# Patient Record
Sex: Female | Born: 1950 | Race: White | Hispanic: No | Smoking: Never smoker
Health system: Southern US, Community
[De-identification: ages and names within clinical notes are randomized; demographics above are authoritative.]

## PROBLEM LIST (undated history)

## (undated) DIAGNOSIS — J449 Chronic obstructive pulmonary disease, unspecified: Secondary | ICD-10-CM

## (undated) DIAGNOSIS — I8390 Asymptomatic varicose veins of unspecified lower extremity: Secondary | ICD-10-CM

## (undated) DIAGNOSIS — E785 Hyperlipidemia, unspecified: Secondary | ICD-10-CM

## (undated) DIAGNOSIS — K5790 Diverticulosis of intestine, part unspecified, without perforation or abscess without bleeding: Secondary | ICD-10-CM

## (undated) DIAGNOSIS — R519 Headache, unspecified: Secondary | ICD-10-CM

## (undated) DIAGNOSIS — Z889 Allergy status to unspecified drugs, medicaments and biological substances status: Secondary | ICD-10-CM

## (undated) DIAGNOSIS — K2270 Barrett's esophagus without dysplasia: Secondary | ICD-10-CM

## (undated) DIAGNOSIS — IMO0001 Reserved for inherently not codable concepts without codable children: Secondary | ICD-10-CM

## (undated) DIAGNOSIS — M810 Age-related osteoporosis without current pathological fracture: Secondary | ICD-10-CM

## (undated) DIAGNOSIS — F32A Depression, unspecified: Secondary | ICD-10-CM

## (undated) DIAGNOSIS — R51 Headache: Secondary | ICD-10-CM

## (undated) DIAGNOSIS — K219 Gastro-esophageal reflux disease without esophagitis: Secondary | ICD-10-CM

## (undated) DIAGNOSIS — A0472 Enterocolitis due to Clostridium difficile, not specified as recurrent: Secondary | ICD-10-CM

## (undated) DIAGNOSIS — F329 Major depressive disorder, single episode, unspecified: Secondary | ICD-10-CM

## (undated) DIAGNOSIS — J45909 Unspecified asthma, uncomplicated: Secondary | ICD-10-CM

## (undated) DIAGNOSIS — Z8719 Personal history of other diseases of the digestive system: Secondary | ICD-10-CM

## (undated) DIAGNOSIS — Z8709 Personal history of other diseases of the respiratory system: Secondary | ICD-10-CM

## (undated) DIAGNOSIS — R197 Diarrhea, unspecified: Secondary | ICD-10-CM

## (undated) HISTORY — PX: TONSILLECTOMY: SUR1361

## (undated) HISTORY — DX: Enterocolitis due to Clostridium difficile, not specified as recurrent: A04.72

## (undated) HISTORY — PX: DILATION AND CURETTAGE OF UTERUS: SHX78

## (undated) HISTORY — PX: APPENDECTOMY: SHX54

## (undated) HISTORY — PX: TUBAL LIGATION: SHX77

## (undated) HISTORY — PX: COLONOSCOPY WITH ESOPHAGOGASTRODUODENOSCOPY (EGD): SHX5779

---

## 2007-06-21 ENCOUNTER — Ambulatory Visit: Payer: Self-pay | Admitting: Family Medicine

## 2007-10-11 ENCOUNTER — Ambulatory Visit: Payer: Self-pay | Admitting: Family Medicine

## 2011-03-13 ENCOUNTER — Ambulatory Visit: Payer: Self-pay | Admitting: Gastroenterology

## 2011-04-16 ENCOUNTER — Ambulatory Visit: Payer: Self-pay | Admitting: Gastroenterology

## 2011-04-21 LAB — PATHOLOGY REPORT

## 2011-09-18 ENCOUNTER — Ambulatory Visit: Payer: Self-pay | Admitting: Gastroenterology

## 2011-09-29 ENCOUNTER — Ambulatory Visit: Payer: Self-pay | Admitting: Gastroenterology

## 2012-03-18 ENCOUNTER — Ambulatory Visit: Payer: Self-pay | Admitting: Gastroenterology

## 2013-11-11 DIAGNOSIS — R519 Headache, unspecified: Secondary | ICD-10-CM | POA: Insufficient documentation

## 2014-03-02 ENCOUNTER — Ambulatory Visit: Payer: Self-pay | Admitting: Gastroenterology

## 2014-03-06 LAB — PATHOLOGY REPORT

## 2014-04-26 DIAGNOSIS — G43909 Migraine, unspecified, not intractable, without status migrainosus: Secondary | ICD-10-CM | POA: Insufficient documentation

## 2014-04-26 DIAGNOSIS — K219 Gastro-esophageal reflux disease without esophagitis: Secondary | ICD-10-CM | POA: Insufficient documentation

## 2014-05-02 ENCOUNTER — Ambulatory Visit: Payer: Self-pay | Admitting: Gastroenterology

## 2015-04-29 DIAGNOSIS — F32A Depression, unspecified: Secondary | ICD-10-CM | POA: Insufficient documentation

## 2015-11-08 DIAGNOSIS — M810 Age-related osteoporosis without current pathological fracture: Secondary | ICD-10-CM | POA: Insufficient documentation

## 2015-11-08 DIAGNOSIS — T7840XA Allergy, unspecified, initial encounter: Secondary | ICD-10-CM | POA: Insufficient documentation

## 2015-11-08 DIAGNOSIS — E785 Hyperlipidemia, unspecified: Secondary | ICD-10-CM | POA: Insufficient documentation

## 2015-11-13 ENCOUNTER — Other Ambulatory Visit: Payer: Self-pay | Admitting: Family Medicine

## 2015-11-13 DIAGNOSIS — Z1231 Encounter for screening mammogram for malignant neoplasm of breast: Secondary | ICD-10-CM

## 2015-11-28 ENCOUNTER — Ambulatory Visit: Payer: Self-pay

## 2015-12-04 DIAGNOSIS — Z1231 Encounter for screening mammogram for malignant neoplasm of breast: Secondary | ICD-10-CM | POA: Diagnosis not present

## 2015-12-09 DIAGNOSIS — M810 Age-related osteoporosis without current pathological fracture: Secondary | ICD-10-CM | POA: Diagnosis not present

## 2015-12-29 DIAGNOSIS — R05 Cough: Secondary | ICD-10-CM | POA: Diagnosis not present

## 2015-12-29 DIAGNOSIS — J45901 Unspecified asthma with (acute) exacerbation: Secondary | ICD-10-CM | POA: Diagnosis not present

## 2016-01-09 DIAGNOSIS — K219 Gastro-esophageal reflux disease without esophagitis: Secondary | ICD-10-CM | POA: Diagnosis not present

## 2016-01-09 DIAGNOSIS — J4531 Mild persistent asthma with (acute) exacerbation: Secondary | ICD-10-CM | POA: Diagnosis not present

## 2016-01-09 DIAGNOSIS — J310 Chronic rhinitis: Secondary | ICD-10-CM | POA: Diagnosis not present

## 2016-02-12 DIAGNOSIS — K2270 Barrett's esophagus without dysplasia: Secondary | ICD-10-CM | POA: Diagnosis not present

## 2016-02-12 DIAGNOSIS — Z86010 Personal history of colonic polyps: Secondary | ICD-10-CM | POA: Diagnosis not present

## 2016-03-23 DIAGNOSIS — A0472 Enterocolitis due to Clostridium difficile, not specified as recurrent: Secondary | ICD-10-CM

## 2016-03-23 HISTORY — DX: Enterocolitis due to Clostridium difficile, not specified as recurrent: A04.72

## 2016-03-27 ENCOUNTER — Encounter: Payer: Self-pay | Admitting: *Deleted

## 2016-03-30 ENCOUNTER — Ambulatory Visit: Payer: PPO | Admitting: Anesthesiology

## 2016-03-30 ENCOUNTER — Encounter
Admission: RE | Disposition: A | Discharge status: Home / Self Care | Payer: Self-pay | Source: Ambulatory Visit | Attending: Gastroenterology

## 2016-03-30 ENCOUNTER — Ambulatory Visit
Admission: RE | Admit: 2016-03-30 | Discharge: 2016-03-30 | Disposition: A | Discharge status: Home / Self Care | Payer: PPO | Source: Ambulatory Visit | Attending: Gastroenterology | Admitting: Gastroenterology

## 2016-03-30 ENCOUNTER — Encounter: Payer: Self-pay | Admitting: *Deleted

## 2016-03-30 DIAGNOSIS — Z1211 Encounter for screening for malignant neoplasm of colon: Secondary | ICD-10-CM | POA: Diagnosis not present

## 2016-03-30 DIAGNOSIS — K2270 Barrett's esophagus without dysplasia: Secondary | ICD-10-CM | POA: Diagnosis not present

## 2016-03-30 DIAGNOSIS — D128 Benign neoplasm of rectum: Secondary | ICD-10-CM | POA: Diagnosis not present

## 2016-03-30 DIAGNOSIS — K5730 Diverticulosis of large intestine without perforation or abscess without bleeding: Secondary | ICD-10-CM | POA: Insufficient documentation

## 2016-03-30 DIAGNOSIS — E785 Hyperlipidemia, unspecified: Secondary | ICD-10-CM | POA: Diagnosis not present

## 2016-03-30 DIAGNOSIS — K621 Rectal polyp: Secondary | ICD-10-CM | POA: Insufficient documentation

## 2016-03-30 DIAGNOSIS — D125 Benign neoplasm of sigmoid colon: Secondary | ICD-10-CM | POA: Insufficient documentation

## 2016-03-30 DIAGNOSIS — K219 Gastro-esophageal reflux disease without esophagitis: Secondary | ICD-10-CM | POA: Diagnosis not present

## 2016-03-30 DIAGNOSIS — D123 Benign neoplasm of transverse colon: Secondary | ICD-10-CM | POA: Diagnosis not present

## 2016-03-30 DIAGNOSIS — Z86010 Personal history of colonic polyps: Secondary | ICD-10-CM | POA: Diagnosis not present

## 2016-03-30 DIAGNOSIS — K449 Diaphragmatic hernia without obstruction or gangrene: Secondary | ICD-10-CM | POA: Insufficient documentation

## 2016-03-30 DIAGNOSIS — Z7951 Long term (current) use of inhaled steroids: Secondary | ICD-10-CM | POA: Diagnosis not present

## 2016-03-30 DIAGNOSIS — J45909 Unspecified asthma, uncomplicated: Secondary | ICD-10-CM | POA: Insufficient documentation

## 2016-03-30 DIAGNOSIS — F329 Major depressive disorder, single episode, unspecified: Secondary | ICD-10-CM | POA: Insufficient documentation

## 2016-03-30 DIAGNOSIS — K2980 Duodenitis without bleeding: Secondary | ICD-10-CM | POA: Diagnosis not present

## 2016-03-30 DIAGNOSIS — K2970 Gastritis, unspecified, without bleeding: Secondary | ICD-10-CM | POA: Insufficient documentation

## 2016-03-30 DIAGNOSIS — J449 Chronic obstructive pulmonary disease, unspecified: Secondary | ICD-10-CM | POA: Insufficient documentation

## 2016-03-30 DIAGNOSIS — K635 Polyp of colon: Secondary | ICD-10-CM | POA: Diagnosis not present

## 2016-03-30 DIAGNOSIS — K2960 Other gastritis without bleeding: Secondary | ICD-10-CM | POA: Diagnosis not present

## 2016-03-30 DIAGNOSIS — Z8719 Personal history of other diseases of the digestive system: Secondary | ICD-10-CM | POA: Diagnosis not present

## 2016-03-30 HISTORY — DX: Age-related osteoporosis without current pathological fracture: M81.0

## 2016-03-30 HISTORY — DX: Headache, unspecified: R51.9

## 2016-03-30 HISTORY — PX: ESOPHAGOGASTRODUODENOSCOPY (EGD) WITH PROPOFOL: SHX5813

## 2016-03-30 HISTORY — DX: Personal history of other diseases of the digestive system: Z87.19

## 2016-03-30 HISTORY — DX: Reserved for inherently not codable concepts without codable children: IMO0001

## 2016-03-30 HISTORY — DX: Unspecified asthma, uncomplicated: J45.909

## 2016-03-30 HISTORY — DX: Major depressive disorder, single episode, unspecified: F32.9

## 2016-03-30 HISTORY — DX: Hyperlipidemia, unspecified: E78.5

## 2016-03-30 HISTORY — DX: Chronic obstructive pulmonary disease, unspecified: J44.9

## 2016-03-30 HISTORY — DX: Gastro-esophageal reflux disease without esophagitis: K21.9

## 2016-03-30 HISTORY — PX: COLONOSCOPY WITH PROPOFOL: SHX5780

## 2016-03-30 HISTORY — DX: Headache: R51

## 2016-03-30 HISTORY — DX: Asymptomatic varicose veins of unspecified lower extremity: I83.90

## 2016-03-30 HISTORY — DX: Depression, unspecified: F32.A

## 2016-03-30 HISTORY — DX: Barrett's esophagus without dysplasia: K22.70

## 2016-03-30 SURGERY — COLONOSCOPY WITH PROPOFOL
Anesthesia: General

## 2016-03-30 MED ORDER — SODIUM CHLORIDE 0.9 % IV SOLN: INTRAVENOUS | Status: DC

## 2016-03-30 MED ORDER — PROPOFOL 10 MG/ML IV BOLUS
INTRAVENOUS | Status: DC | PRN
  Administered 2016-03-30: 20 mg via INTRAVENOUS
  Administered 2016-03-30 (×2): 30 mg via INTRAVENOUS

## 2016-03-30 MED ORDER — IPRATROPIUM-ALBUTEROL 0.5-2.5 (3) MG/3ML IN SOLN
3.0000 mL | Freq: Once | RESPIRATORY_TRACT | Status: AC
Start: 2016-03-30 — End: 2016-03-30
  Administered 2016-03-30: 3 mL via RESPIRATORY_TRACT

## 2016-03-30 MED ORDER — MIDAZOLAM HCL 5 MG/5ML IJ SOLN
INTRAMUSCULAR | Status: DC | PRN
  Administered 2016-03-30: 2 mg via INTRAVENOUS

## 2016-03-30 MED ORDER — LIDOCAINE HCL (CARDIAC) 20 MG/ML IV SOLN
INTRAVENOUS | Status: DC | PRN
  Administered 2016-03-30: 100 mg via INTRAVENOUS

## 2016-03-30 MED ORDER — IPRATROPIUM-ALBUTEROL 0.5-2.5 (3) MG/3ML IN SOLN
RESPIRATORY_TRACT | Status: AC
  Filled 2016-03-30: qty 3

## 2016-03-30 MED ORDER — SODIUM CHLORIDE 0.9 % IV SOLN
INTRAVENOUS | Status: DC
  Administered 2016-03-30: 09:00:00 via INTRAVENOUS
  Administered 2016-03-30: 1000 mL via INTRAVENOUS

## 2016-03-30 MED ORDER — PROPOFOL 500 MG/50ML IV EMUL
INTRAVENOUS | Status: DC | PRN
  Administered 2016-03-30: 140 ug/kg/min via INTRAVENOUS

## 2016-03-30 MED ORDER — EPHEDRINE SULFATE 50 MG/ML IJ SOLN
INTRAMUSCULAR | Status: DC | PRN
  Administered 2016-03-30 (×2): 10 mg via INTRAVENOUS

## 2016-03-30 NOTE — H&P (Signed)
Outpatient short stay form Pre-procedure 03/30/2016 9:24 AM Carly Sails MD  Primary Physician: Dr. Juluis Pitch  Reason for visit:  EGD and colonoscopy  History of present illness:  Patient is a 65 year old female presenting today for EGD and colonoscopy. She has personal history of Barrett's esophagus as well as adenomatous colon polyps. She tolerated her prep well. She takes no blood thinning agents or aspirin products. She does take omeprazole 20 mg daily. She has no dysphagia or heartburn symptoms.    Current facility-administered medications:  .  0.9 %  sodium chloride infusion, , Intravenous, Continuous, Carly Sails, MD, Last Rate: 20 mL/hr at 03/30/16 0857, 1,000 mL at 03/30/16 0857 .  0.9 %  sodium chloride infusion, , Intravenous, Continuous, Carly Sails, MD .  ipratropium-albuterol (DUONEB) 0.5-2.5 (3) MG/3ML nebulizer solution, , , ,   Prescriptions prior to admission  Medication Sig Dispense Refill Last Dose  . ALBUTEROL IN Inhale into the lungs.   03/30/2016 at 0600  . ALPRAZolam (XANAX) 0.5 MG tablet Take 0.5 mg by mouth at bedtime as needed for anxiety.     . Cholecalciferol 1000 units capsule Take 1,000 Units by mouth daily.     . cyclobenzaprine (FLEXERIL) 5 MG tablet Take 5 mg by mouth 3 (three) times daily as needed for muscle spasms.     . fexofenadine (ALLEGRA) 180 MG tablet Take 180 mg by mouth daily.     Marland Kitchen FLUoxetine (PROZAC) 20 MG capsule Take 20 mg by mouth daily.     . Multiple Vitamin (MULTIVITAMIN) tablet Take 1 tablet by mouth daily.     Marland Kitchen omeprazole (PRILOSEC) 20 MG capsule Take 20 mg by mouth daily.     . rizatriptan (MAXALT) 10 MG tablet Take 10 mg by mouth as needed for migraine. May repeat in 2 hours if needed     . simvastatin (ZOCOR) 20 MG tablet Take 20 mg by mouth daily.     . sucralfate (CARAFATE) 1 g tablet Take 1 g by mouth 4 (four) times daily -  with meals and at bedtime.     . traZODone (DESYREL) 100 MG tablet Take 100 mg by  mouth at bedtime.     . [DISCONTINUED] azelastine (ASTELIN) 0.1 % nasal spray Place into both nostrils 2 (two) times daily. Use in each nostril as directed     . [DISCONTINUED] budesonide-formoterol (SYMBICORT) 80-4.5 MCG/ACT inhaler Inhale 2 puffs into the lungs 2 (two) times daily.        Allergies  Allergen Reactions  . Penicillins      Past Medical History  Diagnosis Date  . Barrett's esophagus   . COPD (chronic obstructive pulmonary disease) ([place])   . Depression   . GERD (gastroesophageal reflux disease)   . History of hiatal hernia   . Varicose vein   . Hyperlipidemia   . Headache     migranes  . Osteoporosis, postmenopausal   . Asthma   . Shortness of breath dyspnea     Review of systems:      Physical Exam    Heart and lungs: Regular rate and rhythm without rub or gallop, lungs are bilaterally clear.    HEENT: Normocephalic atraumatic eyes are anicteric    Other:     Pertinant exam for procedure: Soft nontender nondistended bowel sounds positive normoactive.    Planned proceedures: EGD, colonoscopy and indicated procedures. I have discussed the risks benefits and complications of procedures to include not limited to bleeding, infection,  perforation and the risk of sedation and the patient wishes to proceed.    Carly Sails, MD Gastroenterology 03/30/2016  9:24 AM

## 2016-03-30 NOTE — Op Note (Signed)
[facility] Gastroenterology Patient Name: Carly Hoffman Procedure Date: 03/30/2016 9:27 AM MRN: [id number] Account #: [id number] Date of Birth: Jan 30, 1951 Admit Type: Outpatient Age: 65 Room: [facility] ENDO ROOM 3 Gender: Female Note Status: Finalized Procedure:            Colonoscopy Indications:          Personal history of colonic polyps Providers:            Lollie Sails, MD Referring MD:         Youlanda Roys. Lovie Macadamia, MD (Referring MD) Medicines:            Monitored Anesthesia Care Complications:        No immediate complications. Procedure:            Pre-Anesthesia Assessment:                       - ASA Grade Assessment: III - A patient with severe                        systemic disease.                       After obtaining informed consent, the colonoscope was                        passed under direct vision. Throughout the procedure,                        the patient's blood pressure, pulse, and oxygen                        saturations were monitored continuously. The                        Colonoscope was introduced through the anus and                        advanced to the the cecum, identified by appendiceal                        orifice and ileocecal valve. The colonoscopy was                        performed with moderate difficulty. Findings:      Multiple small-mouthed diverticula were found in the sigmoid colon and       descending colon.      A 16 mm polyp was found in the hepatic flexure. The polyp was sessile.       The polyp was removed with a cold biopsy forceps. The polyp was removed       with a cold snare. Resection and retrieval were complete.      A 4 mm polyp was found in the hepatic flexure. The polyp was sessile.       The polyp was removed with a cold biopsy forceps. Resection and       retrieval were complete.      A 3 mm polyp was found in the transverse colon. The polyp was sessile.       The polyp was removed with a  cold biopsy forceps. Resection and       retrieval were complete.  Two sessile polyps were found in the sigmoid colon. The polyps were 1 to       2 mm in size. These polyps were removed with a cold biopsy forceps.       Resection and retrieval were complete.      A 2 mm polyp was found in the rectum. The polyp was sessile. The polyp       was removed with a cold biopsy forceps. Resection and retrieval were       complete.      The digital rectal exam was normal. Impression:           - Diverticulosis in the sigmoid colon and in the                        descending colon.                       - One 16 mm polyp at the hepatic flexure, removed with                        a cold snare and removed with a cold biopsy forceps.                        Resected and retrieved.                       - One 4 mm polyp at the hepatic flexure, removed with a                        cold biopsy forceps. Resected and retrieved.                       - One 3 mm polyp in the transverse colon, removed with                        a cold biopsy forceps. Resected and retrieved.                       - Two 1 to 2 mm polyps in the sigmoid colon, removed                        with a cold biopsy forceps. Resected and retrieved.                       - One 2 mm polyp in the rectum, removed with a cold                        biopsy forceps. Resected and retrieved. Recommendation:       - Discharge patient to home.                       - Telephone GI clinic for pathology results in 1 week. Procedure Code(s):    --- Professional ---                       [phone], Colonoscopy, flexible; with removal of tumor(s),                        polyp(s), or other lesion(s) by snare technique  [id number], 74, Colonoscopy, flexible; with biopsy, single                        or multiple Diagnosis Code(s):    --- Professional ---                       D12.3, Benign neoplasm of transverse colon (hepatic                         flexure or splenic flexure)                       K62.1, Rectal polyp                       D12.5, Benign neoplasm of sigmoid colon                       Z86.010, Personal history of colonic polyps                       K57.30, Diverticulosis of large intestine without                        perforation or abscess without bleeding CPT copyright 2016 American Medical Association. All rights reserved. The codes documented in this report are preliminary and upon coder review may  be revised to meet current compliance requirements. Lollie Sails, MD 03/30/2016 10:39:03 AM This report has been signed electronically. Number of Addenda: 0 Note Initiated On: 03/30/2016 9:27 AM Scope Withdrawal Time: 0 hours 10 minutes 22 seconds  Total Procedure Duration: 0 hours 34 minutes 12 seconds       [facility]

## 2016-03-30 NOTE — Anesthesia Postprocedure Evaluation (Signed)
Anesthesia Post Note  Patient: ZAMAYAH ENSOR  Procedure(s) Performed: Procedure(s) (LRB): COLONOSCOPY WITH PROPOFOL (N/A) ESOPHAGOGASTRODUODENOSCOPY (EGD) WITH PROPOFOL (N/A)  Patient location during evaluation: Endoscopy Anesthesia Type: General Level of consciousness: awake and alert Pain management: pain level controlled Vital Signs Assessment: post-procedure vital signs reviewed and stable Respiratory status: spontaneous breathing, nonlabored ventilation, respiratory function stable and patient connected to nasal cannula oxygen Cardiovascular status: blood pressure returned to baseline and stable Postop Assessment: no signs of nausea or vomiting Anesthetic complications: no    Last Vitals:  Filed Vitals:   03/30/16 1100 03/30/16 1110  BP: 120/58 128/52  Pulse: 81 77  Temp:    Resp: 16 10    Last Pain: There were no vitals filed for this visit.               Precious Haws Piscitello

## 2016-03-30 NOTE — Op Note (Signed)
[facility] Gastroenterology Patient Name: Natily Hull Procedure Date: 03/30/2016 9:28 AM MRN: [id number] Account #: [id number] Date of Birth: July 30, 1951 Admit Type: Outpatient Age: 65 Room: [facility] ENDO ROOM 3 Gender: Female Note Status: Finalized Procedure:            Upper GI endoscopy Indications:          Follow-up of Barrett's esophagus Providers:            Lollie Sails, MD Referring MD:         Youlanda Roys. Lovie Macadamia, MD (Referring MD) Medicines:            Monitored Anesthesia Care Complications:        No immediate complications. Procedure:            Pre-Anesthesia Assessment:                       - ASA Grade Assessment: III - A patient with severe                        systemic disease.                       After obtaining informed consent, the endoscope was                        passed under direct vision. Throughout the procedure,                        the patient's blood pressure, pulse, and oxygen                        saturations were monitored continuously. The Endoscope                        was introduced through the mouth, and advanced to the                        third part of duodenum. The upper GI endoscopy was                        accomplished without difficulty. The patient tolerated                        the procedure. Findings:      A small sliding hiatal hernia was present.      There were esophageal mucosal changes secondary to established       short-segment Barrett's disease present at the gastroesophageal       junction. The maximum longitudinal extent of these mucosal changes was 1       cm in length. Mucosa was biopsied with a cold forceps for histology.       Mucosa was biopsied with a cold forceps for histology in 4 quadrants.      The exam of the esophagus was otherwise normal.      Patchy minimal inflammation characterized by erosions was found in the       gastric antrum. Biopsies were taken with a cold  forceps for histology.       Biopsies were taken with a cold forceps for histology and Helicobacter       pylori testing.  A small hiatal hernia was found. The Z-line was a variable distance from       incisors; the hiatal hernia was sliding.      The exam of the stomach was otherwise normal.      Patchy mild inflammation characterized by erythema and granularity was       found in the duodenal bulb. Impression:           - Small hiatal hernia.                       - Esophageal mucosal changes secondary to established                        short-segment Barrett's disease. Biopsied.                       - Gastritis. Biopsied.                       - Small hiatal hernia.                       - Duodenitis. Recommendation:       - Perform a colonoscopy today. Procedure Code(s):    --- Professional ---                       [phone], Esophagogastroduodenoscopy, flexible, transoral;                        with biopsy, single or multiple Diagnosis Code(s):    --- Professional ---                       K44.9, Diaphragmatic hernia without obstruction or                        gangrene                       K22.70, Barrett's esophagus without dysplasia                       K29.70, Gastritis, unspecified, without bleeding                       K29.80, Duodenitis without bleeding CPT copyright 2016 American Medical Association. All rights reserved. The codes documented in this report are preliminary and upon coder review may  be revised to meet current compliance requirements. Lollie Sails, MD 03/30/2016 9:56:00 AM This report has been signed electronically. Number of Addenda: 0 Note Initiated On: 03/30/2016 9:28 AM      [facility]

## 2016-03-30 NOTE — Anesthesia Preprocedure Evaluation (Signed)
Anesthesia Evaluation  Patient identified by MRN, date of birth, ID band Patient awake    Reviewed: Allergy & Precautions, H&P , NPO status , Patient's Chart, lab work & pertinent test results  History of Anesthesia Complications Negative for: history of anesthetic complications  Airway Mallampati: III  TM Distance: >3 FB Neck ROM: limited    Dental  (+) Poor Dentition, Chipped, Missing, Partial Lower, Partial Upper   Pulmonary shortness of breath and with exertion, asthma , COPD,    Pulmonary exam normal breath sounds clear to auscultation       Cardiovascular Exercise Tolerance: Good (-) angina(-) Past MI and (-) DOE negative cardio ROS Normal cardiovascular exam Rhythm:regular Rate:Normal     Neuro/Psych  Headaches, PSYCHIATRIC DISORDERS Depression    GI/Hepatic Neg liver ROS, hiatal hernia, GERD  ,  Endo/Other  negative endocrine ROS  Renal/GU negative Renal ROS  negative genitourinary   Musculoskeletal   Abdominal   Peds  Hematology negative hematology ROS (+)   Anesthesia Other Findings Past Medical History:   Barrett's esophagus                                          COPD (chronic obstructive pulmonary disease) (*              Depression                                                   GERD (gastroesophageal reflux disease)                       History of hiatal hernia                                     Varicose vein                                                Hyperlipidemia                                               Headache                                                       Comment:migranes   Osteoporosis, postmenopausal                                 Asthma  Shortness of breath dyspnea                                 Past Surgical History:   COLONOSCOPY WITH ESOPHAGOGASTRODUODENOSCOPY (E*               TUBAL LIGATION                                                 APPENDECTOMY                                                  TONSILLECTOMY                                                 DILATION AND CURETTAGE OF UTERUS                                Reproductive/Obstetrics negative OB ROS                             Anesthesia Physical Anesthesia Plan  ASA: III  Anesthesia Plan: General   Post-op Pain Management:    Induction:   Airway Management Planned:   Additional Equipment:   Intra-op Plan:   Post-operative Plan:   Informed Consent: I have reviewed the patients History and Physical, chart, labs and discussed the procedure including the risks, benefits and alternatives for the proposed anesthesia with the patient or authorized representative who has indicated his/her understanding and acceptance.   Dental Advisory Given  Plan Discussed with: Anesthesiologist, CRNA and Surgeon  Anesthesia Plan Comments:         Anesthesia Quick Evaluation

## 2016-03-30 NOTE — Transfer of Care (Signed)
Immediate Anesthesia Transfer of Care Note  Patient: Carly Hoffman  Procedure(s) Performed: Procedure(s): COLONOSCOPY WITH PROPOFOL (N/A) ESOPHAGOGASTRODUODENOSCOPY (EGD) WITH PROPOFOL (N/A)  Patient Location: PACU  Anesthesia Type:General  Level of Consciousness: awake  Airway & Oxygen Therapy: Patient Spontanous Breathing and Patient connected to nasal cannula oxygen  Post-op Assessment: Report given to RN and Post -op Vital signs reviewed and stable  Post vital signs: Reviewed and stable  Last Vitals:  Filed Vitals:   03/30/16 0842  BP: 123/84  Pulse: 80  Temp: 36.3 C  Resp: 18    Last Pain: There were no vitals filed for this visit.       Complications: No apparent anesthesia complications

## 2016-03-31 ENCOUNTER — Encounter: Payer: Self-pay | Admitting: Gastroenterology

## 2016-03-31 LAB — SURGICAL PATHOLOGY

## 2016-04-08 DIAGNOSIS — R1084 Generalized abdominal pain: Secondary | ICD-10-CM | POA: Diagnosis not present

## 2016-04-08 DIAGNOSIS — R109 Unspecified abdominal pain: Secondary | ICD-10-CM | POA: Diagnosis not present

## 2016-04-08 DIAGNOSIS — R197 Diarrhea, unspecified: Secondary | ICD-10-CM | POA: Diagnosis not present

## 2016-04-13 ENCOUNTER — Other Ambulatory Visit
Admission: RE | Admit: 2016-04-13 | Discharge: 2016-04-13 | Disposition: A | Discharge status: Home / Self Care | Payer: PPO | Source: Ambulatory Visit | Attending: Gastroenterology | Admitting: Gastroenterology

## 2016-04-13 DIAGNOSIS — R1084 Generalized abdominal pain: Secondary | ICD-10-CM | POA: Insufficient documentation

## 2016-04-13 DIAGNOSIS — R197 Diarrhea, unspecified: Secondary | ICD-10-CM | POA: Insufficient documentation

## 2016-04-13 LAB — GASTROINTESTINAL PANEL BY PCR, STOOL (REPLACES STOOL CULTURE)
Adenovirus F40/41: NOT DETECTED
Astrovirus: NOT DETECTED
CRYPTOSPORIDIUM: NOT DETECTED
Campylobacter species: NOT DETECTED
Cyclospora cayetanensis: NOT DETECTED
E. COLI O157: NOT DETECTED
ENTAMOEBA HISTOLYTICA: NOT DETECTED
Enteroaggregative E coli (EAEC): NOT DETECTED
Enteropathogenic E coli (EPEC): NOT DETECTED
Enterotoxigenic E coli (ETEC): NOT DETECTED
Giardia lamblia: NOT DETECTED
NOROVIRUS GI/GII: NOT DETECTED
PLESIMONAS SHIGELLOIDES: NOT DETECTED
Rotavirus A: NOT DETECTED
SALMONELLA SPECIES: NOT DETECTED
SAPOVIRUS (I, II, IV, AND V): NOT DETECTED
SHIGELLA/ENTEROINVASIVE E COLI (EIEC): NOT DETECTED
Shiga like toxin producing E coli (STEC): NOT DETECTED
VIBRIO CHOLERAE: NOT DETECTED
Vibrio species: NOT DETECTED
YERSINIA ENTEROCOLITICA: NOT DETECTED

## 2016-04-13 LAB — C DIFFICILE QUICK SCREEN W PCR REFLEX
C DIFFICILE (CDIFF) INTERP: POSITIVE
C Diff antigen: POSITIVE — AB
C Diff toxin: POSITIVE — AB

## 2016-04-29 DIAGNOSIS — F329 Major depressive disorder, single episode, unspecified: Secondary | ICD-10-CM | POA: Diagnosis not present

## 2016-04-29 DIAGNOSIS — R197 Diarrhea, unspecified: Secondary | ICD-10-CM | POA: Diagnosis not present

## 2016-04-29 DIAGNOSIS — E785 Hyperlipidemia, unspecified: Secondary | ICD-10-CM | POA: Diagnosis not present

## 2016-04-29 DIAGNOSIS — G43009 Migraine without aura, not intractable, without status migrainosus: Secondary | ICD-10-CM | POA: Diagnosis not present

## 2016-04-29 DIAGNOSIS — K22719 Barrett's esophagus with dysplasia, unspecified: Secondary | ICD-10-CM | POA: Diagnosis not present

## 2016-05-08 DIAGNOSIS — Z9851 Tubal ligation status: Secondary | ICD-10-CM | POA: Diagnosis not present

## 2016-05-08 DIAGNOSIS — Z880 Allergy status to penicillin: Secondary | ICD-10-CM | POA: Diagnosis not present

## 2016-05-08 DIAGNOSIS — Z8711 Personal history of peptic ulcer disease: Secondary | ICD-10-CM | POA: Diagnosis not present

## 2016-05-08 DIAGNOSIS — Z86010 Personal history of colonic polyps: Secondary | ICD-10-CM | POA: Diagnosis not present

## 2016-05-08 DIAGNOSIS — G4700 Insomnia, unspecified: Secondary | ICD-10-CM | POA: Diagnosis not present

## 2016-05-08 DIAGNOSIS — E869 Volume depletion, unspecified: Secondary | ICD-10-CM | POA: Diagnosis not present

## 2016-05-08 DIAGNOSIS — R509 Fever, unspecified: Secondary | ICD-10-CM | POA: Diagnosis not present

## 2016-05-08 DIAGNOSIS — R7303 Prediabetes: Secondary | ICD-10-CM | POA: Diagnosis not present

## 2016-05-08 DIAGNOSIS — Z801 Family history of malignant neoplasm of trachea, bronchus and lung: Secondary | ICD-10-CM | POA: Diagnosis not present

## 2016-05-08 DIAGNOSIS — F419 Anxiety disorder, unspecified: Secondary | ICD-10-CM | POA: Diagnosis not present

## 2016-05-08 DIAGNOSIS — F329 Major depressive disorder, single episode, unspecified: Secondary | ICD-10-CM | POA: Diagnosis not present

## 2016-05-08 DIAGNOSIS — E785 Hyperlipidemia, unspecified: Secondary | ICD-10-CM | POA: Diagnosis not present

## 2016-05-08 DIAGNOSIS — R739 Hyperglycemia, unspecified: Secondary | ICD-10-CM | POA: Diagnosis not present

## 2016-05-08 DIAGNOSIS — A047 Enterocolitis due to Clostridium difficile: Secondary | ICD-10-CM | POA: Diagnosis not present

## 2016-05-08 DIAGNOSIS — R109 Unspecified abdominal pain: Secondary | ICD-10-CM | POA: Diagnosis not present

## 2016-05-08 DIAGNOSIS — D72829 Elevated white blood cell count, unspecified: Secondary | ICD-10-CM | POA: Diagnosis not present

## 2016-05-08 DIAGNOSIS — R1084 Generalized abdominal pain: Secondary | ICD-10-CM | POA: Diagnosis not present

## 2016-05-08 DIAGNOSIS — K2270 Barrett's esophagus without dysplasia: Secondary | ICD-10-CM | POA: Diagnosis not present

## 2016-05-08 DIAGNOSIS — K649 Unspecified hemorrhoids: Secondary | ICD-10-CM | POA: Diagnosis not present

## 2016-05-08 DIAGNOSIS — J45909 Unspecified asthma, uncomplicated: Secondary | ICD-10-CM | POA: Diagnosis not present

## 2016-05-08 DIAGNOSIS — G43909 Migraine, unspecified, not intractable, without status migrainosus: Secondary | ICD-10-CM | POA: Diagnosis not present

## 2016-05-09 DIAGNOSIS — G43909 Migraine, unspecified, not intractable, without status migrainosus: Secondary | ICD-10-CM | POA: Diagnosis not present

## 2016-05-09 DIAGNOSIS — E785 Hyperlipidemia, unspecified: Secondary | ICD-10-CM | POA: Diagnosis not present

## 2016-05-09 DIAGNOSIS — A047 Enterocolitis due to Clostridium difficile: Secondary | ICD-10-CM | POA: Diagnosis not present

## 2016-05-09 DIAGNOSIS — J45909 Unspecified asthma, uncomplicated: Secondary | ICD-10-CM | POA: Diagnosis not present

## 2016-05-10 DIAGNOSIS — A047 Enterocolitis due to Clostridium difficile: Secondary | ICD-10-CM | POA: Diagnosis not present

## 2016-05-10 DIAGNOSIS — J45909 Unspecified asthma, uncomplicated: Secondary | ICD-10-CM | POA: Diagnosis not present

## 2016-05-10 DIAGNOSIS — G43909 Migraine, unspecified, not intractable, without status migrainosus: Secondary | ICD-10-CM | POA: Diagnosis not present

## 2016-05-10 DIAGNOSIS — E785 Hyperlipidemia, unspecified: Secondary | ICD-10-CM | POA: Diagnosis not present

## 2016-05-11 DIAGNOSIS — E785 Hyperlipidemia, unspecified: Secondary | ICD-10-CM | POA: Diagnosis not present

## 2016-05-11 DIAGNOSIS — A047 Enterocolitis due to Clostridium difficile: Secondary | ICD-10-CM | POA: Diagnosis not present

## 2016-05-11 DIAGNOSIS — J45909 Unspecified asthma, uncomplicated: Secondary | ICD-10-CM | POA: Diagnosis not present

## 2016-05-11 DIAGNOSIS — G43909 Migraine, unspecified, not intractable, without status migrainosus: Secondary | ICD-10-CM | POA: Diagnosis not present

## 2016-05-20 DIAGNOSIS — J310 Chronic rhinitis: Secondary | ICD-10-CM | POA: Diagnosis not present

## 2016-05-20 DIAGNOSIS — J4520 Mild intermittent asthma, uncomplicated: Secondary | ICD-10-CM | POA: Diagnosis not present

## 2016-05-25 DIAGNOSIS — R110 Nausea: Secondary | ICD-10-CM | POA: Diagnosis not present

## 2016-05-25 DIAGNOSIS — A047 Enterocolitis due to Clostridium difficile: Secondary | ICD-10-CM | POA: Diagnosis not present

## 2016-06-09 DIAGNOSIS — R197 Diarrhea, unspecified: Secondary | ICD-10-CM | POA: Diagnosis not present

## 2016-06-10 ENCOUNTER — Other Ambulatory Visit
Admission: RE | Admit: 2016-06-10 | Discharge: 2016-06-10 | Disposition: A | Discharge status: Home / Self Care | Payer: PPO | Source: Ambulatory Visit | Attending: Gastroenterology | Admitting: Gastroenterology

## 2016-06-10 DIAGNOSIS — R197 Diarrhea, unspecified: Secondary | ICD-10-CM | POA: Insufficient documentation

## 2016-06-10 LAB — GASTROINTESTINAL PANEL BY PCR, STOOL (REPLACES STOOL CULTURE)
ASTROVIRUS: NOT DETECTED
Adenovirus F40/41: NOT DETECTED
CAMPYLOBACTER SPECIES: NOT DETECTED
CRYPTOSPORIDIUM: NOT DETECTED
Cyclospora cayetanensis: NOT DETECTED
E. COLI O157: NOT DETECTED
ENTAMOEBA HISTOLYTICA: NOT DETECTED
ENTEROAGGREGATIVE E COLI (EAEC): NOT DETECTED
ENTEROTOXIGENIC E COLI (ETEC): NOT DETECTED
Enteropathogenic E coli (EPEC): NOT DETECTED
GIARDIA LAMBLIA: NOT DETECTED
NOROVIRUS GI/GII: NOT DETECTED
PLESIMONAS SHIGELLOIDES: NOT DETECTED
Rotavirus A: NOT DETECTED
SALMONELLA SPECIES: NOT DETECTED
SHIGELLA/ENTEROINVASIVE E COLI (EIEC): NOT DETECTED
Sapovirus (I, II, IV, and V): NOT DETECTED
Shiga like toxin producing E coli (STEC): NOT DETECTED
Vibrio cholerae: NOT DETECTED
Vibrio species: NOT DETECTED
Yersinia enterocolitica: NOT DETECTED

## 2016-06-10 LAB — C DIFFICILE QUICK SCREEN W PCR REFLEX
C DIFFICILE (CDIFF) TOXIN: POSITIVE — AB
C DIFFICLE (CDIFF) ANTIGEN: POSITIVE — AB
C Diff interpretation: DETECTED

## 2016-09-04 ENCOUNTER — Other Ambulatory Visit
Admission: RE | Admit: 2016-09-04 | Discharge: 2016-09-04 | Disposition: A | Discharge status: Home / Self Care | Payer: PPO | Source: Other Acute Inpatient Hospital | Attending: Gastroenterology | Admitting: Gastroenterology

## 2016-09-04 DIAGNOSIS — K529 Noninfective gastroenteritis and colitis, unspecified: Secondary | ICD-10-CM | POA: Insufficient documentation

## 2016-09-04 LAB — CLOSTRIDIUM DIFFICILE BY PCR: CDIFFPCR: POSITIVE — AB

## 2016-09-04 LAB — C DIFFICILE QUICK SCREEN W PCR REFLEX
C DIFFICILE (CDIFF) TOXIN: NEGATIVE
C DIFFICLE (CDIFF) ANTIGEN: POSITIVE — AB

## 2016-09-08 DIAGNOSIS — A0472 Enterocolitis due to Clostridium difficile, not specified as recurrent: Secondary | ICD-10-CM | POA: Diagnosis not present

## 2016-09-25 ENCOUNTER — Ambulatory Visit
Admission: RE | Admit: 2016-09-25 | Discharge: 2016-09-25 | Disposition: A | Discharge status: Home / Self Care | Payer: PPO | Source: Ambulatory Visit | Attending: Unknown Physician Specialty | Admitting: Unknown Physician Specialty

## 2016-09-25 ENCOUNTER — Ambulatory Visit: Payer: PPO | Admitting: Certified Registered"

## 2016-09-25 ENCOUNTER — Encounter: Payer: Self-pay | Admitting: *Deleted

## 2016-09-25 ENCOUNTER — Encounter
Admission: RE | Disposition: A | Discharge status: Home / Self Care | Payer: Self-pay | Source: Ambulatory Visit | Attending: Unknown Physician Specialty

## 2016-09-25 DIAGNOSIS — A0471 Enterocolitis due to Clostridium difficile, recurrent: Secondary | ICD-10-CM | POA: Insufficient documentation

## 2016-09-25 DIAGNOSIS — F329 Major depressive disorder, single episode, unspecified: Secondary | ICD-10-CM | POA: Diagnosis not present

## 2016-09-25 DIAGNOSIS — Z7951 Long term (current) use of inhaled steroids: Secondary | ICD-10-CM | POA: Diagnosis not present

## 2016-09-25 DIAGNOSIS — E785 Hyperlipidemia, unspecified: Secondary | ICD-10-CM | POA: Diagnosis not present

## 2016-09-25 DIAGNOSIS — K219 Gastro-esophageal reflux disease without esophagitis: Secondary | ICD-10-CM | POA: Insufficient documentation

## 2016-09-25 DIAGNOSIS — G43909 Migraine, unspecified, not intractable, without status migrainosus: Secondary | ICD-10-CM | POA: Diagnosis not present

## 2016-09-25 DIAGNOSIS — Z79899 Other long term (current) drug therapy: Secondary | ICD-10-CM | POA: Insufficient documentation

## 2016-09-25 DIAGNOSIS — R197 Diarrhea, unspecified: Secondary | ICD-10-CM | POA: Diagnosis not present

## 2016-09-25 DIAGNOSIS — J449 Chronic obstructive pulmonary disease, unspecified: Secondary | ICD-10-CM | POA: Insufficient documentation

## 2016-09-25 DIAGNOSIS — A0472 Enterocolitis due to Clostridium difficile, not specified as recurrent: Secondary | ICD-10-CM | POA: Diagnosis not present

## 2016-09-25 HISTORY — DX: Allergy status to unspecified drugs, medicaments and biological substances: Z88.9

## 2016-09-25 HISTORY — DX: Diverticulosis of intestine, part unspecified, without perforation or abscess without bleeding: K57.90

## 2016-09-25 HISTORY — PX: COLONOSCOPY WITH PROPOFOL: SHX5780

## 2016-09-25 HISTORY — DX: Barrett's esophagus without dysplasia: K22.70

## 2016-09-25 HISTORY — PX: FECAL TRANSPLANT: SHX6383

## 2016-09-25 HISTORY — DX: Diarrhea, unspecified: R19.7

## 2016-09-25 SURGERY — COLONOSCOPY WITH PROPOFOL
Anesthesia: General

## 2016-09-25 MED ORDER — SODIUM CHLORIDE 0.9 % IV SOLN
INTRAVENOUS | Status: DC
  Administered 2016-09-25: 1000 mL via INTRAVENOUS

## 2016-09-25 MED ORDER — FENTANYL CITRATE (PF) 100 MCG/2ML IJ SOLN
INTRAMUSCULAR | Status: DC | PRN
  Administered 2016-09-25: 30 ug via INTRAVENOUS
  Administered 2016-09-25: 50 ug via INTRAVENOUS

## 2016-09-25 MED ORDER — PROPOFOL 10 MG/ML IV BOLUS
INTRAVENOUS | Status: DC | PRN
  Administered 2016-09-25: 60 mg via INTRAVENOUS

## 2016-09-25 MED ORDER — BACITRACIN-NEOMYCIN-POLYMYXIN OINTMENT TUBE
1.0000 "application " | TOPICAL_OINTMENT | Freq: Every day | CUTANEOUS | Status: DC
  Administered 2016-09-25: 1 via TOPICAL
  Filled 2016-09-25: qty 15

## 2016-09-25 MED ORDER — SODIUM CHLORIDE 0.9 % IV SOLN: INTRAVENOUS | Status: DC

## 2016-09-25 MED ORDER — LIDOCAINE HCL (CARDIAC) 20 MG/ML IV SOLN
INTRAVENOUS | Status: DC | PRN
  Administered 2016-09-25: 60 mg via INTRAVENOUS

## 2016-09-25 MED ORDER — PROPOFOL 500 MG/50ML IV EMUL
INTRAVENOUS | Status: DC | PRN
  Administered 2016-09-25: 150 ug/kg/min via INTRAVENOUS

## 2016-09-25 NOTE — OR Nursing (Signed)
Patient turned on right side at 12 noon. 12:30 patient used bed pan to urinate, then turned to back. 1300 patient turned to left side. 13:30 patient turned to back. 1400 Patient on right side. 14:30 patient turned top back. 1500 patient turned to left side. . Patient to eat yogurt any flavor every day for a month and take a probiotic. Per Dr. Vira Agar. Patient has a irritated area in fold under stomach she complain of burning sensation in area, neosporin ointment applied to area, it appears to be a skin tear from ties on gown. MD aware. 15:30 pm patient done for 4 hours and is leaving via wheelchair, no complaints noted.

## 2016-09-25 NOTE — Transfer of Care (Signed)
Immediate Anesthesia Transfer of Care Note  Patient: Carly Hoffman  Procedure(s) Performed: Procedure(s): COLONOSCOPY WITH PROPOFOL (N/A) FECAL TRANSPLANT (N/A)  Patient Location: PACU  Anesthesia Type:General  Level of Consciousness: awake, alert  and responds to stimulation  Airway & Oxygen Therapy: Patient Spontanous Breathing and Patient connected to nasal cannula oxygen  Post-op Assessment: Report given to RN and Post -op Vital signs reviewed and stable  Post vital signs: Reviewed and stable  Last Vitals:  Vitals:   09/25/16 1033 09/25/16 1139  BP: (!) 143/72 (!) 121/58  Pulse: 93 73  Resp: 18 (!) 26  Temp: 37.4 C     Last Pain:  Vitals:   09/25/16 1033  TempSrc: Tympanic  PainSc: 6          Complications: No apparent anesthesia complications

## 2016-09-25 NOTE — Anesthesia Preprocedure Evaluation (Signed)
Anesthesia Evaluation  Patient identified by MRN, date of birth, ID band Patient awake    Reviewed: Allergy & Precautions, NPO status , Patient's Chart, lab work & pertinent test results  Airway Mallampati: II       Dental  (+) Teeth Intact   Pulmonary asthma ,    breath sounds clear to auscultation       Cardiovascular Exercise Tolerance: Good  Rhythm:Regular     Neuro/Psych  Headaches, Depression    GI/Hepatic Neg liver ROS, hiatal hernia, GERD  Medicated,  Endo/Other  negative endocrine ROS  Renal/GU negative Renal ROS     Musculoskeletal negative musculoskeletal ROS (+)   Abdominal Normal abdominal exam  (+)   Peds negative pediatric ROS (+)  Hematology negative hematology ROS (+)   Anesthesia Other Findings   Reproductive/Obstetrics                             Anesthesia Physical Anesthesia Plan  ASA: II  Anesthesia Plan: General   Post-op Pain Management:    Induction: Intravenous  Airway Management Planned: Natural Airway and Nasal Cannula  Additional Equipment:   Intra-op Plan:   Post-operative Plan:   Informed Consent: I have reviewed the patients History and Physical, chart, labs and discussed the procedure including the risks, benefits and alternatives for the proposed anesthesia with the patient or authorized representative who has indicated his/her understanding and acceptance.     Plan Discussed with: CRNA  Anesthesia Plan Comments:         Anesthesia Quick Evaluation

## 2016-09-25 NOTE — Anesthesia Postprocedure Evaluation (Signed)
Anesthesia Post Note  Patient: Carly Hoffman  Procedure(s) Performed: Procedure(s) (LRB): COLONOSCOPY WITH PROPOFOL (N/A) FECAL TRANSPLANT (N/A)  Patient location during evaluation: PACU Anesthesia Type: General Level of consciousness: awake Pain management: satisfactory to patient Vital Signs Assessment: post-procedure vital signs reviewed and stable Respiratory status: spontaneous breathing Cardiovascular status: stable Anesthetic complications: no    Last Vitals:  Vitals:   09/25/16 1220 09/25/16 1230  BP: (!) 118/56 (!) 122/54  Pulse: 70 74  Resp: 14 15  Temp:      Last Pain:  Vitals:   09/25/16 1138  TempSrc: Tympanic  PainSc:                  VAN STAVEREN,[place]

## 2016-09-25 NOTE — Anesthesia Procedure Notes (Signed)
Performed by: Esabella Stockinger Pre-anesthesia Checklist: Patient identified, Emergency Drugs available, Suction available, Patient being monitored and Timeout performed Patient Re-evaluated:Patient Re-evaluated prior to inductionOxygen Delivery Method: Nasal cannula Intubation Type: IV induction       

## 2016-09-25 NOTE — OR Nursing (Signed)
PT WITH SKIN IRRITATION LEFT SUPRAPUBIC AREA. DR ELLIOTT NOTIFIED AND ORDERED NEOSPORIN TOPICAL QD

## 2016-09-25 NOTE — H&P (Signed)
Primary Care Physician:  Juluis Pitch, MD Primary Gastroenterologist:  Dr. Vira Agar  Pre-Procedure History & Physical: HPI:  Carly Hoffman is a 65 y.o. female is here for an colonoscopy.   Past Medical History:  Diagnosis Date  . Asthma   . Barrett esophagus   . Barrett's esophagus   . COPD (chronic obstructive pulmonary disease) ([place])   . Depression   . Diarrhea   . Diverticulosis   . GERD (gastroesophageal reflux disease)   . H/O seasonal allergies   . Headache    migranes  . History of hiatal hernia   . Hyperlipidemia   . Osteoporosis, postmenopausal   . Shortness of breath dyspnea   . Varicose vein     Past Surgical History:  Procedure Laterality Date  . APPENDECTOMY    . COLONOSCOPY WITH ESOPHAGOGASTRODUODENOSCOPY (EGD)    . COLONOSCOPY WITH PROPOFOL N/A 03/30/2016   Procedure: COLONOSCOPY WITH PROPOFOL;  Surgeon: Lollie Sails, MD;  Location: [facility] ENDOSCOPY;  Service: Endoscopy;  Laterality: N/A;  . DILATION AND CURETTAGE OF UTERUS    . ESOPHAGOGASTRODUODENOSCOPY (EGD) WITH PROPOFOL N/A 03/30/2016   Procedure: ESOPHAGOGASTRODUODENOSCOPY (EGD) WITH PROPOFOL;  Surgeon: Lollie Sails, MD;  Location: [facility] ENDOSCOPY;  Service: Endoscopy;  Laterality: N/A;  . TONSILLECTOMY    . TUBAL LIGATION      Prior to Admission medications   Medication Sig Start Date End Date Taking? Authorizing Provider  dicyclomine (BENTYL) 10 MG capsule Take 10 mg by mouth 4 (four) times daily -  before meals and at bedtime.   Yes Historical Provider, MD  fluticasone (FLONASE) 50 MCG/ACT nasal spray Place 2 sprays into both nostrils daily.   Yes Historical Provider, MD  ondansetron (ZOFRAN-ODT) 4 MG disintegrating tablet Take 4 mg by mouth every 8 (eight) hours as needed for nausea or vomiting.   Yes Historical Provider, MD  ALBUTEROL IN Inhale into the lungs.    Historical Provider, MD  ALPRAZolam Duanne Moron) 0.5 MG tablet Take 0.5 mg by mouth at bedtime as needed for anxiety.     Historical Provider, MD  Cholecalciferol 1000 units capsule Take 1,000 Units by mouth daily.    Historical Provider, MD  cyclobenzaprine (FLEXERIL) 5 MG tablet Take 5 mg by mouth 3 (three) times daily as needed for muscle spasms.    Historical Provider, MD  fexofenadine (ALLEGRA) 180 MG tablet Take 180 mg by mouth daily.    Historical Provider, MD  FLUoxetine (PROZAC) 20 MG capsule Take 20 mg by mouth daily.    Historical Provider, MD  Multiple Vitamin (MULTIVITAMIN) tablet Take 1 tablet by mouth daily.    Historical Provider, MD  omeprazole (PRILOSEC) 20 MG capsule Take 20 mg by mouth daily.    Historical Provider, MD  rizatriptan (MAXALT) 10 MG tablet Take 10 mg by mouth as needed for migraine. May repeat in 2 hours if needed    Historical Provider, MD  simvastatin (ZOCOR) 20 MG tablet Take 20 mg by mouth daily.    Historical Provider, MD  sucralfate (CARAFATE) 1 g tablet Take 1 g by mouth 4 (four) times daily -  with meals and at bedtime.    Historical Provider, MD  traZODone (DESYREL) 100 MG tablet Take 100 mg by mouth at bedtime.    Historical Provider, MD    Allergies as of 09/11/2016 - Review Complete 03/30/2016  Allergen Reaction Noted  . Penicillins  03/27/2016    Family History  Problem Relation Age of Onset  . Emphysema Mother   .  Lung cancer Father   . Atrial fibrillation Brother     Social History   Social History  . Marital status: Divorced    Spouse name: N/A  . Number of children: N/A  . Years of education: N/A   Occupational History  . Not on file.   Social History Main Topics  . Smoking status: Never Smoker  . Smokeless tobacco: Never Used  . Alcohol use No  . Drug use: No  . Sexual activity: Not on file   Other Topics Concern  . Not on file   Social History Narrative  . No narrative on file    Review of Systems: See HPI, otherwise negative ROS  Physical Exam: BP (!) 122/54   Pulse 74   Temp 98.6 F (37 C) (Tympanic)   Resp 15   Ht 5\' 5"   (1.651 m)   Wt 69.4 kg (153 lb)   SpO2 98%   BMI 25.46 kg/m  General:   Alert,  pleasant and cooperative in NAD Head:  Normocephalic and atraumatic. Neck:  Supple; no masses or thyromegaly. Lungs:  Clear throughout to auscultation.    Heart:  Regular rate and rhythm. Abdomen:  Soft, nontender and nondistended. Normal bowel sounds, without guarding, and without rebound.   Neurologic:  Alert and  oriented x4;  grossly normal neurologically.  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  Risks, benefits, limitations, and alternatives regarding  colonoscopy have been reviewed with the patient.  Questions have been answered.  All parties agreeable.   Gaylyn Cheers, MD  09/25/2016, 12:50 PM

## 2016-09-25 NOTE — Op Note (Addendum)
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 Lovie Macadamia, MD (Referring MD) Medicines:            Propofol per Anesthesia Complications:        No immediate complications. Procedure:            Pre-Anesthesia Assessment:                       - After reviewing the risks and benefits, the patient                        was deemed in satisfactory condition to undergo the                        procedure.                       After obtaining informed consent, the colonoscope was                        passed under direct vision. Throughout the procedure,                        the patient's blood pressure, pulse, and oxygen                        saturations were monitored continuously. The                        Colonoscope was introduced through the anus and                        advanced to the the cecum, identified by appendiceal                        orifice and ileocecal valve. The colonoscopy was                        performed without difficulty. The patient tolerated the                        procedure well. The quality of the bowel preparation                        was good. Findings:      Fecal Microbiota Transplant (Bacteriotherapy): Donor stool was prepared       by me using  milk and tap water as per protocol. Approximately 350 mL of       the emulsified donor stool was instilled at the hepatic flexure, in the       ascending  colon and in the cecum. A detailed colonoscopic exam could not       be performed upon scope withdrawal secondary to limited visibility from       the instilled stool. Small amount of stool present and seen while       advancing the scope was lavaged and suctioned. No evidence of acute       colitis at time of procedure. Impression:           - Fecal Microbiota Transplant (Bacteriotherapy)                        performed in the hepatic flexure, in the ascending                        colon and in the cecum.                       - No specimens collected. Recommendation:       - The findings and recommendations were discussed with                        the patient's family. To post op for 4 hours with                        turning every 30 minutes. Manya Silvas, MD 09/25/2016 11:39:53 AM This report has been signed electronically. Number of Addenda: 0 Note Initiated On: 09/25/2016 11:13 AM Scope Withdrawal Time: 0 hours 4 minutes 25 seconds  Total Procedure Duration: 0 hours 16 minutes 29 seconds       [facility]

## 2016-09-28 ENCOUNTER — Encounter: Payer: Self-pay | Admitting: Unknown Physician Specialty

## 2016-10-02 DIAGNOSIS — R1032 Left lower quadrant pain: Secondary | ICD-10-CM | POA: Diagnosis not present

## 2016-10-02 DIAGNOSIS — R1031 Right lower quadrant pain: Secondary | ICD-10-CM | POA: Diagnosis not present

## 2016-10-02 DIAGNOSIS — Z8719 Personal history of other diseases of the digestive system: Secondary | ICD-10-CM | POA: Diagnosis not present

## 2016-10-05 DIAGNOSIS — R0989 Other specified symptoms and signs involving the circulatory and respiratory systems: Secondary | ICD-10-CM | POA: Diagnosis not present

## 2016-10-05 DIAGNOSIS — R1032 Left lower quadrant pain: Secondary | ICD-10-CM | POA: Diagnosis not present

## 2016-10-05 DIAGNOSIS — N390 Urinary tract infection, site not specified: Secondary | ICD-10-CM | POA: Diagnosis not present

## 2016-10-05 DIAGNOSIS — R1031 Right lower quadrant pain: Secondary | ICD-10-CM | POA: Diagnosis not present

## 2016-10-05 DIAGNOSIS — D72829 Elevated white blood cell count, unspecified: Secondary | ICD-10-CM | POA: Diagnosis not present

## 2016-10-13 ENCOUNTER — Encounter (INDEPENDENT_AMBULATORY_CARE_PROVIDER_SITE_OTHER): Payer: Self-pay | Admitting: Vascular Surgery

## 2016-10-13 ENCOUNTER — Ambulatory Visit (INDEPENDENT_AMBULATORY_CARE_PROVIDER_SITE_OTHER): Payer: PPO | Admitting: Vascular Surgery

## 2016-10-13 VITALS — BP 128/74 | HR 72 | Resp 16 | Ht 65.0 in | Wt 157.0 lb

## 2016-10-13 DIAGNOSIS — A0472 Enterocolitis due to Clostridium difficile, not specified as recurrent: Secondary | ICD-10-CM | POA: Diagnosis not present

## 2016-10-13 DIAGNOSIS — I10 Essential (primary) hypertension: Secondary | ICD-10-CM | POA: Insufficient documentation

## 2016-10-13 DIAGNOSIS — R0989 Other specified symptoms and signs involving the circulatory and respiratory systems: Secondary | ICD-10-CM | POA: Diagnosis not present

## 2016-10-13 DIAGNOSIS — I8390 Asymptomatic varicose veins of unspecified lower extremity: Secondary | ICD-10-CM

## 2016-10-13 DIAGNOSIS — E785 Hyperlipidemia, unspecified: Secondary | ICD-10-CM | POA: Insufficient documentation

## 2016-10-13 NOTE — Assessment & Plan Note (Signed)
lipid control important in reducing the progression of atherosclerotic disease. Continue statin therapy  

## 2016-10-13 NOTE — Assessment & Plan Note (Deleted)
blood pressure control important in reducing the progression of atherosclerotic disease. On appropriate oral medications.  

## 2016-10-13 NOTE — Patient Instructions (Signed)
Chronic Mesenteric Ischemia Mesenteric ischemia is a deficiency of blood in an area of the intestine supplied by an artery that supports the intestine. Chronic mesenteric ischemia, also called intestinal angina, is a long-term condition. It happens when an artery or vein that supports the intestine gradually becomes blocked or narrow, restricting the blood supply to the intestine. When the blood supply to the intestine is severely restricted, the intestines cannot function properly because needed oxygen cannot reach them.  CAUSES   Fatty deposits that build up in an artery or vein but have not yet restricted blood flow entirely.  Differences in some people's anatomy.  Rapid weight loss.  Weakened areas in blood vessel walls (aneurysms).  Swelling and inflammation of blood vessels (such as from fibromuscular dysplasia and arteritis).  Disorders of blood clotting.  Scarring and fibrosis of blood vessels after radiation therapy.  Blood vessel problems after drug use, such as use of cocaine. RISK FACTORS  Being female.  Being over age 50 with a history of coronary or vascular disease.  Smoking.  Congestive heart failure.  Diabetes.  High cholesterol.  High blood pressure (hypertension). SIGNS AND SYMPTOMS   Severe stomachache. Some people become fearful of eating because of pain.   Abdominal pain or cramps that develop about 30 minutes after a meal.   Abdominal pain after eating that becomes worse over time.   Diarrhea.   Nausea.   Vomiting.   Bloating.   Weight loss. DIAGNOSIS  Chronic mesenteric ischemia is often diagnosed after the person's history is taken, a physical exam is done, and tests are taken. Tests may include:  Ultrasounds.  CT scans.  Angiography. This is an imaging test that uses a dye to obtain a picture of blood flow to the intestine.  Endoscopy. This involves putting a scope through the mouth, down the throat, and into the stomach and  intestine to view the intestinal wall and take small tissue samples (biopsies).  Tonometry. In this test a tiny probe is passed through the mouth and into the stomach or intestine and left in place for 24 hours or more. It measures the output of carbon dioxide by the affected tissues. TREATMENT  Treatment may include:   Medicines to reduce blood clotting and increase blood flow.   Surgery to remove the blockage, repair arteries or veins, and restore blood flow. This may involve:   Angioplasty. This is surgery to widen the affected artery, reduce the blockage, and sometimes insert a small, mesh tube (stent).   Bypass surgery. This may be performed to bypass the blockage and reconnect healthy arteries or veins.   A stent in the affected area to help keep blocked arteries open. HOME CARE INSTRUCTIONS  Only take over-the-counter or prescription medicines as directed by your health care provider.   Keep all follow-up appointments as directed by your health care provider.   Prevent the condition from occurring by:  Doing regular exercise.  Keeping a healthy weight.  Keeping a healthy diet.  Managing cholesterol levels.  Keeping blood pressure and heart rhythm problems under control.  Not smoking. SEEK IMMEDIATE MEDICAL CARE IF:  You have severe abdominal pain.   You notice blood in your stool.   You have nausea, vomiting, or diarrhea.   You have a fever. MAKE SURE YOU:  Understand these instructions.  Will watch your condition.  Will get help right away if you are not doing well or get worse. This information is not intended to replace advice given to   you by your health care provider. Make sure you discuss any questions you have with your health care provider. Document Released: 06/29/2011 Document Revised: 07/12/2013 Document Reviewed: 05/10/2013 Elsevier Interactive Patient Education  2017 [employer]vier Inc.  

## 2016-10-13 NOTE — Assessment & Plan Note (Signed)
The patient has a long-standing history of varicose veins that are not particularly bothersome to her. Given the mild at most nature of her symptoms, no aggressive therapy will be performed at this time.

## 2016-10-13 NOTE — Progress Notes (Signed)
Patient ID: Carly Hoffman, female   DOB: 1951/07/18, 65 y.o.   MRN: [id number]  Chief Complaint  Patient presents with  . New Evaluation    Stomach blood flow    HPI Carly Hoffman is a 65 y.o. female.  I am asked to see the patient by Dr. Vira Agar for evaluation of an abdominal bruit.  The patient reports Spending much of the year getting treatment for C. difficile colitis. She has had multiple antibiotic regimens and recently had to have a fecal transplant. This does seem to be improving her diarrhea and abdominal pain. As part of her workup, she was noted to have an abdominal bruit. She denies malignant hypertension. She denies classic chronic mesenteric ischemic symptoms. The patient does describe a long-standing history of varicose veins. These are not optically painful and she does not have a lot of lower extremity swelling. Her right leg has little more than the left leg.   Past Medical History:  Diagnosis Date  . Asthma   . Barrett esophagus   . Barrett's esophagus   . COPD (chronic obstructive pulmonary disease) ([place])   . Depression   . Diarrhea   . Diverticulosis   . GERD (gastroesophageal reflux disease)   . H/O seasonal allergies   . Headache    migranes  . History of hiatal hernia   . Hyperlipidemia   . Osteoporosis, postmenopausal   . Shortness of breath dyspnea   . Varicose vein     Past Surgical History:  Procedure Laterality Date  . APPENDECTOMY    . COLONOSCOPY WITH ESOPHAGOGASTRODUODENOSCOPY (EGD)    . COLONOSCOPY WITH PROPOFOL N/A 03/30/2016   Procedure: COLONOSCOPY WITH PROPOFOL;  Surgeon: Lollie Sails, MD;  Location: [facility] ENDOSCOPY;  Service: Endoscopy;  Laterality: N/A;  . COLONOSCOPY WITH PROPOFOL N/A 09/25/2016   Procedure: COLONOSCOPY WITH PROPOFOL;  Surgeon: Manya Silvas, MD;  Location: [facility] ENDOSCOPY;  Service: Endoscopy;  Laterality: N/A;  . DILATION AND CURETTAGE OF UTERUS    . ESOPHAGOGASTRODUODENOSCOPY (EGD) WITH PROPOFOL N/A  03/30/2016   Procedure: ESOPHAGOGASTRODUODENOSCOPY (EGD) WITH PROPOFOL;  Surgeon: Lollie Sails, MD;  Location: [facility] ENDOSCOPY;  Service: Endoscopy;  Laterality: N/A;  . FECAL TRANSPLANT N/A 09/25/2016   Procedure: FECAL TRANSPLANT;  Surgeon: Manya Silvas, MD;  Location: [facility] ENDOSCOPY;  Service: Endoscopy;  Laterality: N/A;  . TONSILLECTOMY    . TUBAL LIGATION      Family History  Problem Relation Age of Onset  . Emphysema Mother   . Lung cancer Father   . Atrial fibrillation Brother    No bleeding or clotting disorders  Social History Social History  Substance Use Topics  . Smoking status: Never Smoker  . Smokeless tobacco: Never Used  . Alcohol use No  No IV drug use  Allergies  Allergen Reactions  . Penicillins     G    Current Outpatient Prescriptions  Medication Sig Dispense Refill  . ALBUTEROL IN Inhale into the lungs.    . ALPRAZolam (XANAX) 0.5 MG tablet Take 0.5 mg by mouth at bedtime as needed for anxiety.    . Cholecalciferol 1000 units capsule Take 1,000 Units by mouth daily.    . cyclobenzaprine (FLEXERIL) 5 MG tablet Take 5 mg by mouth 3 (three) times daily as needed for muscle spasms.    Marland Kitchen dicyclomine (BENTYL) 10 MG capsule Take 10 mg by mouth 4 (four) times daily -  before meals and at bedtime.    . fexofenadine (  ALLEGRA) 180 MG tablet Take 180 mg by mouth daily.    Marland Kitchen FLUoxetine (PROZAC) 20 MG capsule Take 20 mg by mouth daily.    . Multiple Vitamin (MULTIVITAMIN) tablet Take 1 tablet by mouth daily.    Marland Kitchen omeprazole (PRILOSEC) 20 MG capsule Take 20 mg by mouth daily.    . ondansetron (ZOFRAN-ODT) 4 MG disintegrating tablet Take 4 mg by mouth every 8 (eight) hours as needed for nausea or vomiting.    . rizatriptan (MAXALT) 10 MG tablet Take 10 mg by mouth as needed for migraine. May repeat in 2 hours if needed    . senna-docusate (SENOKOT-S) 8.6-50 MG tablet Take by mouth.    . simvastatin (ZOCOR) 20 MG tablet Take 20 mg by mouth daily.    .  sucralfate (CARAFATE) 1 g tablet Take 1 g by mouth 4 (four) times daily -  with meals and at bedtime.    . traZODone (DESYREL) 100 MG tablet Take 100 mg by mouth at bedtime.     No current facility-administered medications for this visit.       REVIEW OF SYSTEMS (Negative unless checked)  Constitutional: [] Weight loss  [] Fever  [] Chills Cardiac: [] Chest pain   [] Chest pressure   [] Palpitations   [] Shortness of breath when laying flat   [] Shortness of breath at rest   [] Shortness of breath with exertion. Vascular:  [] Pain in legs with walking   [] Pain in legs at rest   [] Pain in legs when laying flat   [] Claudication   [] Pain in feet when walking  [] Pain in feet at rest  [] Pain in feet when laying flat   [] History of DVT   [] Phlebitis   [] Swelling in legs   [x] Varicose veins   [] Non-healing ulcers Pulmonary:   [] Uses home oxygen   [] Productive cough   [] Hemoptysis   [] Wheeze  [] COPD   [] Asthma Neurologic:  [] Dizziness  [] Blackouts   [] Seizures   [] History of stroke   [] History of TIA  [] Aphasia   [] Temporary blindness   [] Dysphagia   [] Weakness or numbness in arms   [] Weakness or numbness in legs Musculoskeletal:  [] Arthritis   [] Joint swelling   [] Joint pain   [] Low back pain Hematologic:  [] Easy bruising  [] Easy bleeding   [] Hypercoagulable state   [] Anemic  [] Hepatitis Gastrointestinal:  [] Blood in stool   [] Vomiting blood  [] Gastroesophageal reflux/heartburn   [x] Abdominal pain Genitourinary:  [] Chronic kidney disease   [] Difficult urination  [] Frequent urination  [] Burning with urination   [] Hematuria Skin:  [] Rashes   [] Ulcers   [] Wounds Psychological:  [] History of anxiety   []  History of major depression.    Physical Exam BP 128/74 (BP Location: Right Arm)   Pulse 72   Resp 16   Ht 5\' 5"  (1.651 m)   Wt 157 lb (71.2 kg)   BMI 26.13 kg/m  Gen:  WD/WN, NAD Head: [place]/AT, No temporalis wasting. Prominent temp pulse not noted. Ear/Nose/Throat: Hearing grossly intact, nares w/o  erythema or drainage, oropharynx w/o Erythema/Exudate Eyes: Conjunctiva clear, sclera non-icteric  Neck: trachea midline.  No bruit or JVD.  Pulmonary:  Good air movement, clear to auscultation bilaterally.  Cardiac: RRR, normal S1, S2, no Murmurs, rubs or gallops. Vascular:  Vessel Right Left  Radial Palpable Palpable  Ulnar Palpable Palpable  Brachial Palpable Palpable  Carotid Palpable, without bruit Palpable, without bruit  Aorta Not palpable N/A  Femoral Palpable Palpable  Popliteal Palpable Palpable  PT Palpable Palpable  DP Palpable Palpable  Gastrointestinal: soft, non-tender/non-distended. No guarding/reflex. No masses, surgical incisions, or scars.  Abdominal bruit is present Musculoskeletal: M/S 5/5 throughout.  Extremities without ischemic changes.  No deformity or atrophy. Scattered varicosities are present bilaterally Neurologic: Sensation grossly intact in extremities.  Symmetrical.  Speech is fluent. Motor exam as listed above. Psychiatric: Judgment intact, Mood & affect appropriate for pt's clinical situation. Dermatologic: No rashes or ulcers noted.  No cellulitis or open wounds. Lymph : No Cervical, Axillary, or Inguinal lymphadenopathy.   Radiology No results found.  Labs Recent Results (from the past 2160 hour(s))  C difficile quick scan w PCR reflex     Status: Abnormal   Collection Time: 09/04/16 10:45 AM  Result Value Ref Range   C Diff antigen POSITIVE (A) NEGATIVE   C Diff toxin NEGATIVE NEGATIVE   C Diff interpretation Results are indeterminate. See PCR results.   Clostridium Difficile by PCR     Status: Abnormal   Collection Time: 09/04/16 10:45 AM  Result Value Ref Range   Toxigenic C Difficile by pcr POSITIVE (A) NEGATIVE    Comment: Positive for toxigenic C. difficile with little to no toxin production. Only treat if clinical presentation suggests symptomatic illness.    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 difficile colitis and had a fecal transplant earlier this month. This seems to be improving her symptoms significantly. Still following with the GI service who will be monitoring this.  Abdominal bruit The patient has an abdominal bruit that was noted on her physical exam by her gastroenterologist. She does not have any significant poorly controlled hypertension. She does not have any significant postprandial abdominal pain, unintentional weight loss, or food fear. I discussed with her that a mesenteric duplex be a reasonable option to evaluate this abdominal bruit. I have discussed this was unlikely the cause of her colitis, but assessment of her visceral perfusion could impact healing potential. We will obtain this in the near future at her convenience. She will continue her statin agent and I will see her back following the study.  Hyperlipidemia lipid control important in reducing the progression of atherosclerotic disease. Continue statin therapy   Asymptomatic varicose veins The patient has a long-standing history of varicose veins that are not particularly bothersome to her. Given the mild at most nature of her symptoms, no aggressive therapy will be performed at this time.      Leotis Pain 10/13/2016, 2:17 PM   This note was created with Dragon medical transcription system.  Any errors from dictation are unintentional.

## 2016-10-13 NOTE — Assessment & Plan Note (Addendum)
The patient has an abdominal bruit that was noted on her physical exam by her gastroenterologist. She does not have any significant poorly controlled hypertension. She does not have any significant postprandial abdominal pain, unintentional weight loss, or food fear. I discussed with her that a mesenteric duplex be a reasonable option to evaluate this abdominal bruit. I have discussed this was unlikely the cause of her colitis, but assessment of her visceral perfusion could impact healing potential. We will obtain this in the near future at her convenience. She will continue her statin agent and I will see her back following the study.

## 2016-10-13 NOTE — Assessment & Plan Note (Signed)
The patient has had recurrent C. difficile colitis and had a fecal transplant earlier this month. This seems to be improving her symptoms significantly. Still following with the GI service who will be monitoring this.

## 2016-10-28 DIAGNOSIS — J4520 Mild intermittent asthma, uncomplicated: Secondary | ICD-10-CM | POA: Diagnosis not present

## 2016-10-28 DIAGNOSIS — R0602 Shortness of breath: Secondary | ICD-10-CM | POA: Diagnosis not present

## 2016-10-28 DIAGNOSIS — R05 Cough: Secondary | ICD-10-CM | POA: Diagnosis not present

## 2016-11-04 DIAGNOSIS — M79642 Pain in left hand: Secondary | ICD-10-CM | POA: Diagnosis not present

## 2016-11-04 DIAGNOSIS — M79641 Pain in right hand: Secondary | ICD-10-CM | POA: Diagnosis not present

## 2016-11-04 DIAGNOSIS — M25561 Pain in right knee: Secondary | ICD-10-CM | POA: Diagnosis not present

## 2016-11-04 DIAGNOSIS — M19042 Primary osteoarthritis, left hand: Secondary | ICD-10-CM | POA: Diagnosis not present

## 2016-11-10 DIAGNOSIS — G43009 Migraine without aura, not intractable, without status migrainosus: Secondary | ICD-10-CM | POA: Diagnosis not present

## 2016-11-10 DIAGNOSIS — M810 Age-related osteoporosis without current pathological fracture: Secondary | ICD-10-CM | POA: Diagnosis not present

## 2016-11-10 DIAGNOSIS — Z0000 Encounter for general adult medical examination without abnormal findings: Secondary | ICD-10-CM | POA: Diagnosis not present

## 2016-11-10 DIAGNOSIS — T7840XD Allergy, unspecified, subsequent encounter: Secondary | ICD-10-CM | POA: Diagnosis not present

## 2016-11-10 DIAGNOSIS — F329 Major depressive disorder, single episode, unspecified: Secondary | ICD-10-CM | POA: Diagnosis not present

## 2016-11-10 DIAGNOSIS — K22719 Barrett's esophagus with dysplasia, unspecified: Secondary | ICD-10-CM | POA: Diagnosis not present

## 2016-11-10 DIAGNOSIS — R739 Hyperglycemia, unspecified: Secondary | ICD-10-CM | POA: Diagnosis not present

## 2016-11-10 DIAGNOSIS — E785 Hyperlipidemia, unspecified: Secondary | ICD-10-CM | POA: Diagnosis not present

## 2016-11-10 DIAGNOSIS — K219 Gastro-esophageal reflux disease without esophagitis: Secondary | ICD-10-CM | POA: Diagnosis not present

## 2016-11-18 DIAGNOSIS — E785 Hyperlipidemia, unspecified: Secondary | ICD-10-CM | POA: Diagnosis not present

## 2016-11-18 DIAGNOSIS — R739 Hyperglycemia, unspecified: Secondary | ICD-10-CM | POA: Diagnosis not present

## 2016-11-18 DIAGNOSIS — K219 Gastro-esophageal reflux disease without esophagitis: Secondary | ICD-10-CM | POA: Diagnosis not present

## 2016-12-04 ENCOUNTER — Encounter (INDEPENDENT_AMBULATORY_CARE_PROVIDER_SITE_OTHER): Payer: Self-pay | Admitting: Vascular Surgery

## 2016-12-04 ENCOUNTER — Ambulatory Visit (INDEPENDENT_AMBULATORY_CARE_PROVIDER_SITE_OTHER): Payer: PPO | Admitting: Vascular Surgery

## 2016-12-04 ENCOUNTER — Ambulatory Visit (INDEPENDENT_AMBULATORY_CARE_PROVIDER_SITE_OTHER): Payer: PPO

## 2016-12-04 VITALS — BP 133/76 | HR 69 | Resp 16 | Ht 65.5 in | Wt 155.0 lb

## 2016-12-04 DIAGNOSIS — E785 Hyperlipidemia, unspecified: Secondary | ICD-10-CM

## 2016-12-04 DIAGNOSIS — R0989 Other specified symptoms and signs involving the circulatory and respiratory systems: Secondary | ICD-10-CM

## 2016-12-04 DIAGNOSIS — A0472 Enterocolitis due to Clostridium difficile, not specified as recurrent: Secondary | ICD-10-CM | POA: Diagnosis not present

## 2016-12-04 NOTE — Assessment & Plan Note (Signed)
lipid control important in reducing the progression of atherosclerotic disease. Continue statin therapy  

## 2016-12-04 NOTE — Progress Notes (Signed)
MRN : [id number]  Carly Hoffman is a 66 y.o. (Nov 20, 1951) female who presents with chief complaint of  Chief Complaint  Patient presents with  . Re-evaluation    Ultrasound follow up  .  History of Present Illness: Patient returns today in follow up of Her colitis and mesenteric duplex today. She has had no further symptoms and has been doing well at home. She has no complaints today. Mesenteric duplex today reveals no evidence of hemodynamically significant stenosis in the celiac, SMA, or IMA        Past Medical History:  Diagnosis Date  . Asthma   . Barrett esophagus   . Barrett's esophagus   . COPD (chronic obstructive pulmonary disease) ([place])   . Depression   . Diarrhea   . Diverticulosis   . GERD (gastroesophageal reflux disease)   . H/O seasonal allergies   . Headache    migranes  . History of hiatal hernia   . Hyperlipidemia   . Osteoporosis, postmenopausal   . Shortness of breath dyspnea   . Varicose vein          Past Surgical History:  Procedure Laterality Date  . APPENDECTOMY    . COLONOSCOPY WITH ESOPHAGOGASTRODUODENOSCOPY (EGD)    . COLONOSCOPY WITH PROPOFOL N/A 03/30/2016   Procedure: COLONOSCOPY WITH PROPOFOL;  Surgeon: Lollie Sails, MD;  Location: [facility] ENDOSCOPY;  Service: Endoscopy;  Laterality: N/A;  . COLONOSCOPY WITH PROPOFOL N/A 09/25/2016   Procedure: COLONOSCOPY WITH PROPOFOL;  Surgeon: Manya Silvas, MD;  Location: [facility] ENDOSCOPY;  Service: Endoscopy;  Laterality: N/A;  . DILATION AND CURETTAGE OF UTERUS    . ESOPHAGOGASTRODUODENOSCOPY (EGD) WITH PROPOFOL N/A 03/30/2016   Procedure: ESOPHAGOGASTRODUODENOSCOPY (EGD) WITH PROPOFOL;  Surgeon: Lollie Sails, MD;  Location: [facility] ENDOSCOPY;  Service: Endoscopy;  Laterality: N/A;  . 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 TONSILLECTOMY     . TUBAL LIGATION           Family History  Problem Relation Age of Onset  . Emphysema Mother   . Lung cancer Father   . Atrial fibrillation Brother    No bleeding or clotting disorders  Social History     Social History  Substance Use Topics  . Smoking status: Never Smoker  . Smokeless tobacco: Never Used  . Alcohol use No  No IV drug use       Allergies  Allergen Reactions  . Penicillins     G          Current Outpatient Prescriptions  Medication Sig Dispense Refill  . ALBUTEROL IN Inhale into the lungs.    . ALPRAZolam (XANAX) 0.5 MG tablet Take 0.5 mg by mouth at bedtime as needed for anxiety.    . Cholecalciferol 1000 units capsule Take 1,000 Units by mouth daily.    . cyclobenzaprine (FLEXERIL) 5 MG tablet Take 5 mg by mouth 3 (three) times daily as needed for muscle spasms.    Marland Kitchen dicyclomine (BENTYL) 10 MG capsule Take 10 mg by mouth 4 (four) times daily -  before meals and at bedtime.    . fexofenadine (ALLEGRA) 180 MG tablet Take 180 mg by mouth daily.    Marland Kitchen FLUoxetine (PROZAC) 20 MG capsule Take 20 mg by mouth daily.    . Multiple Vitamin (MULTIVITAMIN) tablet Take 1 tablet by mouth daily.    Marland Kitchen  omeprazole (PRILOSEC) 20 MG capsule Take 20 mg by mouth daily.    . ondansetron (ZOFRAN-ODT) 4 MG disintegrating tablet Take 4 mg by mouth every 8 (eight) hours as needed for nausea or vomiting.    . rizatriptan (MAXALT) 10 MG tablet Take 10 mg by mouth as needed for migraine. May repeat in 2 hours if needed    . senna-docusate (SENOKOT-S) 8.6-50 MG tablet Take by mouth.    . simvastatin (ZOCOR) 20 MG tablet Take 20 mg by mouth daily.    . sucralfate (CARAFATE) 1 g tablet Take 1 g by mouth 4 (four) times daily -  with meals and at bedtime.    . traZODone (DESYREL) 100 MG tablet Take 100 mg by mouth at bedtime.     No current facility-administered medications for this visit.       REVIEW OF SYSTEMS (Negative unless  checked)  Constitutional: [] Weight loss  [] Fever  [] Chills Cardiac: [] Chest pain   [] Chest pressure   [] Palpitations   [] Shortness of breath when laying flat   [] Shortness of breath at rest   [] Shortness of breath with exertion. Vascular:  [] Pain in legs with walking   [] Pain in legs at rest   [] Pain in legs when laying flat   [] Claudication   [] Pain in feet when walking  [] Pain in feet at rest  [] Pain in feet when laying flat   [] History of DVT   [] Phlebitis   [] Swelling in legs   [x] Varicose veins   [] Non-healing ulcers Pulmonary:   [] Uses home oxygen   [] Productive cough   [] Hemoptysis   [] Wheeze  [] COPD   [] Asthma Neurologic:  [] Dizziness  [] Blackouts   [] Seizures   [] History of stroke   [] History of TIA  [] Aphasia   [] Temporary blindness   [] Dysphagia   [] Weakness or numbness in arms   [] Weakness or numbness in legs Musculoskeletal:  [] Arthritis   [] Joint swelling   [] Joint pain   [] Low back pain Hematologic:  [] Easy bruising  [] Easy bleeding   [] Hypercoagulable state   [] Anemic  [] Hepatitis Gastrointestinal:  [] Blood in stool   [] Vomiting blood  [] Gastroesophageal reflux/heartburn   [x] Abdominal pain Genitourinary:  [] Chronic kidney disease   [] Difficult urination  [] Frequent urination  [] Burning with urination   [] Hematuria Skin:  [] Rashes   [] Ulcers   [] Wounds Psychological:  [] History of anxiety   []  History of major depression.    Physical Exam BP 128/74 (BP Location: Right Arm)   Pulse 72   Resp 16   Ht 5\' 5"  (1.651 m)   Wt 157 lb (71.2 kg)   BMI 26.13 kg/m  Gen:  WD/WN, NAD Head: [place]/AT, No temporalis wasting. Prominent temp pulse not noted. Ear/Nose/Throat: Hearing grossly intact, nares w/o erythema or drainage, oropharynx w/o Erythema/Exudate Eyes: Conjunctiva clear, sclera non-icteric  Neck: trachea midline.  No JVD.  Pulmonary:  Good air movement, respirations not labored Cardiac: RRR, normal S1, S2 Vascular:  Vessel Right Left  Radial Palpable Palpable                                     Gastrointestinal: soft, non-tender/non-distended. No guarding/reflex. No masses, surgical incisions, or scars.  Abdominal bruit is present Musculoskeletal: M/S 5/5 throughout.  Extremities without ischemic changes.  No deformity or atrophy. Scattered varicosities are present bilaterally Neurologic: Sensation grossly intact in extremities.  Symmetrical.  Speech is fluent. Motor exam as listed above. Psychiatric: Judgment intact,  Mood & affect appropriate for pt's clinical situation. Dermatologic: No rashes or ulcers noted.  No cellulitis or open wounds. Lymph : No Cervical, Axillary, or Inguinal lymphadenopathy.        Labs No results found for this or any previous visit (from the past 2160 hour(s)).  Radiology No results found.    Assessment/Plan  Hyperlipidemia lipid control important in reducing the progression of atherosclerotic disease. Continue statin therapy   Abdominal bruit Mesenteric duplex today demonstrates no evidence of hemodynamically significant stenosis in the celiac, SMA, or IMA.  Enteritis due to Clostridium difficile Mesenteric duplex today reveals no evidence of hemodynamically significant stenosis in the celiac, SMA, or IMA. It does not appear that her colitis had a large vessel ischemic component. No further vascular or interventional procedures or studies are required at this time. Return to clinic on an as-needed basis.    Leotis Pain, MD  12/04/2016 9:03 AM    This note was created with Dragon medical transcription system.  Any errors from dictation are purely unintentional

## 2016-12-04 NOTE — Assessment & Plan Note (Addendum)
Mesenteric duplex today reveals no evidence of hemodynamically significant stenosis in the celiac, SMA, or IMA. It does not appear that her colitis had a large vessel ischemic component. No further vascular or interventional procedures or studies are required at this time. Return to clinic on an as-needed basis.

## 2016-12-04 NOTE — Assessment & Plan Note (Signed)
Mesenteric duplex today demonstrates no evidence of hemodynamically significant stenosis in the celiac, SMA, or IMA.

## 2017-02-17 DIAGNOSIS — D72829 Elevated white blood cell count, unspecified: Secondary | ICD-10-CM | POA: Diagnosis not present

## 2017-03-23 ENCOUNTER — Inpatient Hospital Stay: Payer: PPO

## 2017-03-23 ENCOUNTER — Inpatient Hospital Stay: Payer: PPO | Attending: Oncology | Admitting: Oncology

## 2017-03-23 ENCOUNTER — Encounter (INDEPENDENT_AMBULATORY_CARE_PROVIDER_SITE_OTHER): Payer: Self-pay

## 2017-03-23 ENCOUNTER — Encounter: Payer: Self-pay | Admitting: Oncology

## 2017-03-23 VITALS — BP 152/86 | HR 79 | Temp 96.6°F | Resp 18 | Ht 67.32 in | Wt 162.9 lb

## 2017-03-23 DIAGNOSIS — E785 Hyperlipidemia, unspecified: Secondary | ICD-10-CM | POA: Insufficient documentation

## 2017-03-23 DIAGNOSIS — F329 Major depressive disorder, single episode, unspecified: Secondary | ICD-10-CM | POA: Diagnosis not present

## 2017-03-23 DIAGNOSIS — A0472 Enterocolitis due to Clostridium difficile, not specified as recurrent: Secondary | ICD-10-CM | POA: Insufficient documentation

## 2017-03-23 DIAGNOSIS — K219 Gastro-esophageal reflux disease without esophagitis: Secondary | ICD-10-CM | POA: Diagnosis not present

## 2017-03-23 DIAGNOSIS — J449 Chronic obstructive pulmonary disease, unspecified: Secondary | ICD-10-CM | POA: Diagnosis not present

## 2017-03-23 DIAGNOSIS — D72829 Elevated white blood cell count, unspecified: Secondary | ICD-10-CM

## 2017-03-23 DIAGNOSIS — D72820 Lymphocytosis (symptomatic): Secondary | ICD-10-CM | POA: Insufficient documentation

## 2017-03-23 DIAGNOSIS — K449 Diaphragmatic hernia without obstruction or gangrene: Secondary | ICD-10-CM | POA: Diagnosis not present

## 2017-03-23 LAB — DIFFERENTIAL
BASOS ABS: 0.1 10*3/uL (ref 0–0.1)
BASOS PCT: 1 %
Eosinophils Absolute: 0.2 10*3/uL (ref 0–0.7)
Eosinophils Relative: 1 %
Lymphocytes Relative: 35 %
Lymphs Abs: 4.2 10*3/uL — ABNORMAL HIGH (ref 1.0–3.6)
MONOS PCT: 6 %
Monocytes Absolute: 0.7 10*3/uL (ref 0.2–0.9)
NEUTROS ABS: 6.7 10*3/uL — AB (ref 1.4–6.5)
Neutrophils Relative %: 57 %

## 2017-03-23 LAB — SAVE SMEAR

## 2017-03-23 LAB — CBC
HEMATOCRIT: 38.8 % (ref 35.0–47.0)
HEMOGLOBIN: 13.5 g/dL (ref 12.0–16.0)
MCH: 31 pg (ref 26.0–34.0)
MCHC: 34.8 g/dL (ref 32.0–36.0)
MCV: 89.1 fL (ref 80.0–100.0)
Platelets: 230 10*3/uL (ref 150–440)
RBC: 4.35 MIL/uL (ref 3.80–5.20)
RDW: 12.9 % (ref 11.5–14.5)
WBC: 11.7 10*3/uL — AB (ref 3.6–11.0)

## 2017-03-23 NOTE — Progress Notes (Signed)
Here for new pt eval. Per pt May 8/17 had colonoscopy and then developed C diff. Stated she was hospitalized  In [place]- for 4 days and elevated WBC since then per pt. Bowels are "back to normal now" per pt

## 2017-03-23 NOTE — Progress Notes (Addendum)
Hematology/Oncology Consult note [facility] [phone] [phone]  Patient Care Team: Juluis Pitch, MD as PCP - General (Family Medicine)   Name of the patient: Carly Hoffman  [id number]  06-May-1951    Reason for referral- leucocytosis   Referring physician- Dr. Juluis Pitch  Date of visit: 03/23/17   History of presenting illness- patient is a 66 year old Caucasian female with a history of C. difficile following a colonoscopy in May 2017. At that time her white count was normal. Since then patient had 2 more episodes of C. 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 Since November 2017 patient has had mild leukocytosis. In November 2017 her white count was 15 with predominant neutrophilia and lymphocytosis. H&H and platelet counts have always been normal. In December 2017 and most recently in March 2018 her white count has been around 12.4. Most recent CBC from 02/17/2017 showed white count of 12.4, H&H of 13.8/40.4 and a platelet count 208. Differential showed lymphocytosis with an absolute lymphocyte count of 4.6. Patient has not had any problems with diarrhea or cedar since his stool transplant. She otherwise feels well and denies other complaints today. Denies any other recurrent infections, appetite loss, unintentional weight loss and drenching night sweats.  ECOG PS- 0  Pain scale- 4   Review of systems- Review of Systems  Constitutional: Negative for chills, fever, malaise/fatigue and weight loss.  HENT: Negative for congestion, ear discharge and nosebleeds.   Eyes: Negative for blurred vision.  Respiratory: Negative for cough, hemoptysis, sputum production, shortness of breath and wheezing.   Cardiovascular: Negative for chest pain, palpitations, orthopnea and claudication.  Gastrointestinal: Negative for abdominal pain, blood in stool, constipation, diarrhea, heartburn, melena,  nausea and vomiting.  Genitourinary: Negative for dysuria, flank pain, frequency, hematuria and urgency.  Musculoskeletal: Negative for back pain, joint pain and myalgias.  Skin: Negative for rash.  Neurological: Negative for dizziness, tingling, focal weakness, seizures, weakness and headaches.  Endo/Heme/Allergies: Does not bruise/bleed easily.  Psychiatric/Behavioral: Negative for depression and suicidal ideas. The patient does not have insomnia.     Allergies  Allergen Reactions  . Penicillins     G    Patient Active Problem List   Diagnosis Date Noted  . Enteritis due to Clostridium difficile 10/13/2016  . Abdominal bruit 10/13/2016  . Hyperlipidemia 10/13/2016  . Asymptomatic varicose veins 10/13/2016     Past Medical History:  Diagnosis Date  . Asthma   . Barrett esophagus   . Barrett's esophagus   . COPD (chronic obstructive pulmonary disease) ([place])   . Depression   . Diarrhea   . Diverticulosis   . GERD (gastroesophageal reflux disease)   . H/O seasonal allergies   . Headache    migranes  . History of hiatal hernia   . Hyperlipidemia   . Osteoporosis, postmenopausal   . Shortness of breath dyspnea   . Varicose vein      Past Surgical History:  Procedure Laterality Date  . APPENDECTOMY    . COLONOSCOPY WITH ESOPHAGOGASTRODUODENOSCOPY (EGD)    . COLONOSCOPY WITH PROPOFOL N/A 03/30/2016   Procedure: COLONOSCOPY WITH PROPOFOL;  Surgeon: Lollie Sails, MD;  Location: [facility] ENDOSCOPY;  Service: Endoscopy;  Laterality: N/A;  . COLONOSCOPY WITH PROPOFOL N/A 09/25/2016   Procedure: COLONOSCOPY WITH PROPOFOL;  Surgeon: Manya Silvas, MD;  Location: [facility] ENDOSCOPY;  Service: Endoscopy;  Laterality: N/A;  . DILATION AND CURETTAGE OF UTERUS    . ESOPHAGOGASTRODUODENOSCOPY (EGD)  WITH PROPOFOL N/A 03/30/2016   Procedure: ESOPHAGOGASTRODUODENOSCOPY (EGD) WITH PROPOFOL;  Surgeon: Lollie Sails, MD;  Location: [facility] ENDOSCOPY;  Service: Endoscopy;  Laterality: N/A;    . FECAL TRANSPLANT N/A 09/25/2016   Procedure: FECAL TRANSPLANT;  Surgeon: Manya Silvas, MD;  Location: [facility] ENDOSCOPY;  Service: Endoscopy;  Laterality: N/A;  . TONSILLECTOMY    . TUBAL LIGATION      Social History   Social History  . Marital status: Divorced    Spouse name: N/A  . Number of children: N/A  . Years of education: N/A   Occupational History  . Not on file.   Social History Main Topics  . Smoking status: Never Smoker  . Smokeless tobacco: Never Used  . Alcohol use No  . Drug use: No  . Sexual activity: Not on file   Other Topics Concern  . Not on file   Social History Narrative  . No narrative on file     Family History  Problem Relation Age of Onset  . Emphysema Mother   . Lung cancer Father   . Atrial fibrillation Brother      Current Outpatient Prescriptions:  .  ALPRAZolam (XANAX) 0.5 MG tablet, Take 0.5 mg by mouth at bedtime as needed for anxiety., Disp: , Rfl:  .  budesonide (PULMICORT) 180 MCG/ACT inhaler, Inhale 1 puff into the lungs 2 (two) times daily., Disp: , Rfl:  .  Cholecalciferol 1000 units capsule, Take 1,000 Units by mouth daily., Disp: , Rfl:  .  cyclobenzaprine (FLEXERIL) 5 MG tablet, Take 5 mg by mouth 3 (three) times daily as needed for muscle spasms., Disp: , Rfl:  .  fexofenadine (ALLEGRA) 180 MG tablet, Take 180 mg by mouth daily., Disp: , Rfl:  .  FLUoxetine (PROZAC) 20 MG capsule, Take 20 mg by mouth daily., Disp: , Rfl:  .  fluticasone (FLONASE) 50 MCG/ACT nasal spray, Place into the nose., Disp: , Rfl:  .  Multiple Vitamin (MULTIVITAMIN) tablet, Take 1 tablet by mouth daily., Disp: , Rfl:  .  omeprazole (PRILOSEC) 20 MG capsule, Take 20 mg by mouth daily., Disp: , Rfl:  .  simvastatin (ZOCOR) 20 MG tablet, Take 20 mg by mouth daily., Disp: , Rfl:  .  sucralfate (CARAFATE) 1 g tablet, Take 1 g by mouth 2 (two) times daily. , Disp: , Rfl:  .  traZODone (DESYREL) 100 MG tablet, Take 100 mg by mouth at bedtime., Disp: ,  Rfl:  .  albuterol (PROVENTIL HFA;VENTOLIN HFA) 108 (90 Base) MCG/ACT inhaler, Inhale 2 puffs into the lungs every 6 (six) hours as needed for wheezing or shortness of breath., Disp: , Rfl:  .  dicyclomine (BENTYL) 10 MG capsule, Take 10 mg by mouth 4 (four) times daily -  before meals and at bedtime., Disp: , Rfl:  .  HYDROcodone-acetaminophen (NORCO) 7.5-325 MG tablet, , Disp: , Rfl:  .  ondansetron (ZOFRAN-ODT) 4 MG disintegrating tablet, Take 4 mg by mouth every 8 (eight) hours as needed for nausea or vomiting., Disp: , Rfl:  .  rizatriptan (MAXALT) 10 MG tablet, Take 10 mg by mouth as needed for migraine. May repeat in 2 hours if needed, Disp: , Rfl:  .  senna-docusate (SENOKOT-S) 8.6-50 MG tablet, Take by mouth., Disp: , Rfl:    Physical exam:  Vitals:   03/23/17 1503  BP: (!) 152/86  Pulse: 79  Resp: 18  Temp: (!) 96.6 F (35.9 C)  TempSrc: Tympanic  Weight: 162 lb 14.4  oz (73.9 kg)  Height: 5' 7.32" (1.71 m)   Physical Exam  Constitutional: She is oriented to person, place, and time and well-developed, well-nourished, and in no distress.  HENT:  Head: Normocephalic and atraumatic.  Eyes: EOM are normal. Pupils are equal, round, and reactive to light.  Neck: Normal range of motion.  Cardiovascular: Normal rate, regular rhythm and normal heart sounds.   Pulmonary/Chest: Effort normal and breath sounds normal.  Abdominal: Soft. Bowel sounds are normal.  No palpable splenomegaly  Lymphadenopathy:  No palpable cervical, axillary or inguinal adenopathy  Neurological: She is alert and oriented to person, place, and time.  Skin: Skin is warm and dry.        Assessment and plan- Patient is a 66 y.o. female referred to [place] for leucocytosis predominantly neutrophilia and lymphocytosis  Patients leucocytosis is mild and stable around 12 since last 4 months. This is likely reactive. However given some persistent lymphocytosis, I will repeat cbc, check pathology review of her  smear, peripheral flow cytometry and BCR abl testing to rule out myeloproliferative disorder. If the results of these tests are unremarkable, I will continue to monitor her cbc Q3- Q6 months and consider bm biopsy only if her leucocytosis worsens or if she develops other cytopenias or thrombophilia. I will see her back in 2 weeks time   Thank you for this kind referral and the opportunity to participate in the care of this patient   Visit Diagnosis 1. Leukocytosis, unspecified type     Dr. Randa Evens, MD, MPH [facility] at [facility] Pager- [phone] 03/23/2017

## 2017-03-25 ENCOUNTER — Encounter: Payer: Self-pay | Admitting: Oncology

## 2017-03-27 LAB — BCR-ABL1, CML/ALL, PCR, QUANT

## 2017-03-31 LAB — COMP PANEL: LEUKEMIA/LYMPHOMA

## 2017-04-06 ENCOUNTER — Inpatient Hospital Stay (HOSPITAL_BASED_OUTPATIENT_CLINIC_OR_DEPARTMENT_OTHER): Payer: PPO | Admitting: Oncology

## 2017-04-06 VITALS — BP 145/82 | HR 76 | Temp 97.3°F | Resp 18 | Wt 163.4 lb

## 2017-04-06 DIAGNOSIS — D72820 Lymphocytosis (symptomatic): Secondary | ICD-10-CM | POA: Diagnosis not present

## 2017-04-06 DIAGNOSIS — D729 Disorder of white blood cells, unspecified: Secondary | ICD-10-CM

## 2017-04-06 NOTE — Progress Notes (Signed)
Hematology/Oncology Consult note [facility]  Telephone:([phone] [phone]  Patient Care Team: Juluis Pitch, MD as PCP - General (Family Medicine)   Name of the patient: Carly Hoffman  [id number]  03/04/1951   Date of visit: 04/06/17  Diagnosis- neutrophilia and lymphocytosis likely reactive  Chief complaint/ Reason for visit- discuss results of bloodwork  Heme/Onc history: patient is a 66 year old Caucasian female with a history of C. difficile following a colonoscopy in May 2017. At that time her white count was normal. Since then patient had 2 more episodes of C. 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 Since November 2017 patient has had mild leukocytosis. In November 2017 her white count was 15 with predominant neutrophilia and lymphocytosis. H&H and platelet counts have always been normal. In December 2017 and most recently in March 2018 her white count has been around 12.4. Most recent CBC from 02/17/2017 showed white count of 12.4, H&H of 13.8/40.4 and a platelet count 208. Differential showed lymphocytosis with an absolute lymphocyte count of 4.6. Patient has not had any problems with diarrhea or cedar since his stool transplant. She otherwise feels well and denies other complaints today. Denies any other recurrent infections, appetite loss, unintentional weight loss and drenching night sweats.  Results of blood work from 03/23/2017 were as follows: CBC showed white count of 11.7, H&H of 13.5/38.8 and a platelet count of 230. Differential mainly showed neutrophilia and lymphocytosis peripheral flow cytometry did not reveal any evidence of monoclonal B-cell population. BCR able testing was negative for CML   Interval history- bowel movements are regular. Feels that her energy levels are improving    Review of systems- Review of Systems  Constitutional: Negative for chills, fever,  malaise/fatigue and weight loss.  HENT: Negative for congestion, ear discharge and nosebleeds.   Eyes: Negative for blurred vision.  Respiratory: Negative for cough, hemoptysis, sputum production, shortness of breath and wheezing.   Cardiovascular: Negative for chest pain, palpitations, orthopnea and claudication.  Gastrointestinal: Negative for abdominal pain, blood in stool, constipation, diarrhea, heartburn, melena, nausea and vomiting.  Genitourinary: Negative for dysuria, flank pain, frequency, hematuria and urgency.  Musculoskeletal: Negative for back pain, joint pain and myalgias.  Skin: Negative for rash.  Neurological: Negative for dizziness, tingling, focal weakness, seizures, weakness and headaches.  Endo/Heme/Allergies: Does not bruise/bleed easily.  Psychiatric/Behavioral: Negative for depression and suicidal ideas. The patient does not have insomnia.      Current treatment- observation  Allergies  Allergen Reactions  . Penicillins     G     Past Medical History:  Diagnosis Date  . Asthma   . Barrett esophagus   . Barrett's esophagus   . C. difficile diarrhea 03/2016   hospitalized in [place] x 4 d w c diff after colonoscopy   . COPD (chronic obstructive pulmonary disease) ([place])   . Depression   . Diarrhea   . Diverticulosis   . GERD (gastroesophageal reflux disease)   . H/O seasonal allergies   . Headache    migranes  . History of hiatal hernia   . Hyperlipidemia   . Osteoporosis, postmenopausal   . Shortness of breath dyspnea   . Varicose vein      Past Surgical History:  Procedure Laterality Date  . APPENDECTOMY    . COLONOSCOPY WITH ESOPHAGOGASTRODUODENOSCOPY (EGD)    . COLONOSCOPY WITH PROPOFOL N/A 03/30/2016   Procedure: COLONOSCOPY WITH PROPOFOL;  Surgeon: Lollie Sails, MD;  Location: [facility] ENDOSCOPY;  Service: Endoscopy;  Laterality: N/A;  . COLONOSCOPY WITH PROPOFOL N/A 09/25/2016   Procedure: COLONOSCOPY WITH PROPOFOL;  Surgeon: Manya Silvas, MD;  Location: [facility] ENDOSCOPY;  Service: Endoscopy;  Laterality: N/A;  . DILATION AND CURETTAGE OF UTERUS    . ESOPHAGOGASTRODUODENOSCOPY (EGD) WITH PROPOFOL N/A 03/30/2016   Procedure: ESOPHAGOGASTRODUODENOSCOPY (EGD) WITH PROPOFOL;  Surgeon: Lollie Sails, MD;  Location: [facility] ENDOSCOPY;  Service: Endoscopy;  Laterality: N/A;  . FECAL TRANSPLANT N/A 09/25/2016   Procedure: FECAL TRANSPLANT;  Surgeon: Manya Silvas, MD;  Location: [facility] ENDOSCOPY;  Service: Endoscopy;  Laterality: N/A;  . TONSILLECTOMY    . TUBAL LIGATION      Social History   Social History  . Marital status: Divorced    Spouse name: N/A  . Number of children: N/A  . Years of education: N/A   Occupational History  . Not on file.   Social History Main Topics  . Smoking status: Never Smoker  . Smokeless tobacco: Never Used  . Alcohol use No  . Drug use: No  . Sexual activity: Yes   Other Topics Concern  . Not on file   Social History Narrative  . No narrative on file    Family History  Problem Relation Age of Onset  . Emphysema Mother   . Lung cancer Father   . Emphysema Father   . Atrial fibrillation Father   . Atrial fibrillation Brother      Current Outpatient Prescriptions:  .  albuterol (PROVENTIL HFA;VENTOLIN HFA) 108 (90 Base) MCG/ACT inhaler, Inhale 2 puffs into the lungs every 6 (six) hours as needed for wheezing or shortness of breath., Disp: , Rfl:  .  ALPRAZolam (XANAX) 0.5 MG tablet, Take 0.5 mg by mouth at bedtime as needed for anxiety., Disp: , Rfl:  .  budesonide (PULMICORT) 180 MCG/ACT inhaler, Inhale 1 puff into the lungs 2 (two) times daily., Disp: , Rfl:  .  Cholecalciferol 1000 units capsule, Take 1,000 Units by mouth daily., Disp: , Rfl:  .  cyclobenzaprine (FLEXERIL) 5 MG tablet, Take 5 mg by mouth 3 (three) times daily as needed for muscle spasms., Disp: , Rfl:  .  dicyclomine (BENTYL) 10 MG capsule, Take 10 mg by mouth 4 (four) times daily -  before meals and at  bedtime., Disp: , Rfl:  .  fexofenadine (ALLEGRA) 180 MG tablet, Take 180 mg by mouth daily., Disp: , Rfl:  .  FLUoxetine (PROZAC) 20 MG capsule, Take 20 mg by mouth daily., Disp: , Rfl:  .  fluticasone (FLONASE) 50 MCG/ACT nasal spray, Place into the nose., Disp: , Rfl:  .  HYDROcodone-acetaminophen (NORCO) 7.5-325 MG tablet, , Disp: , Rfl:  .  Multiple Vitamin (MULTIVITAMIN) tablet, Take 1 tablet by mouth daily., Disp: , Rfl:  .  omeprazole (PRILOSEC) 20 MG capsule, Take 20 mg by mouth daily., Disp: , Rfl:  .  ondansetron (ZOFRAN-ODT) 4 MG disintegrating tablet, Take 4 mg by mouth every 8 (eight) hours as needed for nausea or vomiting., Disp: , Rfl:  .  rizatriptan (MAXALT) 10 MG tablet, Take 10 mg by mouth as needed for migraine. May repeat in 2 hours if needed, Disp: , Rfl:  .  senna-docusate (SENOKOT-S) 8.6-50 MG tablet, Take by mouth., Disp: , Rfl:  .  simvastatin (ZOCOR) 20 MG tablet, Take 20 mg by mouth daily., Disp: , Rfl:  .  sucralfate (CARAFATE) 1 g tablet, Take 1 g by mouth 2 (two) times daily. ,  Disp: , Rfl:  .  traZODone (DESYREL) 100 MG tablet, Take 100 mg by mouth at bedtime., Disp: , Rfl:   Physical exam:  Vitals:   04/06/17 1411  BP: (!) 145/82  Pulse: 76  Resp: 18  Temp: 97.3 F (36.3 C)  TempSrc: Tympanic  Weight: 163 lb 6.4 oz (74.1 kg)   Physical Exam  Constitutional: She is oriented to person, place, and time and well-developed, well-nourished, and in no distress.  HENT:  Head: Normocephalic and atraumatic.  Eyes: EOM are normal. Pupils are equal, round, and reactive to light.  Neck: Normal range of motion.  Cardiovascular: Normal rate, regular rhythm and normal heart sounds.   Pulmonary/Chest: Effort normal and breath sounds normal.  Abdominal: Soft. Bowel sounds are normal.  Neurological: She is alert and oriented to person, place, and time.  Skin: Skin is warm and dry.     No flowsheet data found. CBC Latest Ref Rng & Units 03/23/2017  WBC 3.6 - 11.0  K/uL 11.7(H)  Hemoglobin 12.0 - 16.0 g/dL 13.5  Hematocrit 35.0 - 47.0 % 38.8  Platelets 150 - 440 K/uL 230     Assessment and plan- Patient is a 66 y.o. female referred to [place] for mild leucoytosis mainly neutrophilia and lymphocytosis likely reactive.  Flow cytometry and bcr abl testing was negative. She has mild leucocytosis and no associated cytopenias or thrombophilia. Suspicion for myeloproliferative disorder is low. This is likely reactive. I will see her back in 6 months with cbc/ diff.   Visit Diagnosis 1. Neutrophilia   2. Lymphocytosis      Dr. Randa Evens, MD, MPH [facility] at [facility] Pager- [phone] 04/06/2017 2:37 PM

## 2017-04-06 NOTE — Progress Notes (Signed)
Here for follow up.feels well

## 2017-05-11 DIAGNOSIS — J310 Chronic rhinitis: Secondary | ICD-10-CM | POA: Diagnosis not present

## 2017-05-11 DIAGNOSIS — J4520 Mild intermittent asthma, uncomplicated: Secondary | ICD-10-CM | POA: Diagnosis not present

## 2017-05-12 DIAGNOSIS — D72823 Leukemoid reaction: Secondary | ICD-10-CM | POA: Insufficient documentation

## 2017-05-12 DIAGNOSIS — R7309 Other abnormal glucose: Secondary | ICD-10-CM | POA: Diagnosis not present

## 2017-05-12 DIAGNOSIS — K219 Gastro-esophageal reflux disease without esophagitis: Secondary | ICD-10-CM | POA: Diagnosis not present

## 2017-05-12 DIAGNOSIS — E785 Hyperlipidemia, unspecified: Secondary | ICD-10-CM | POA: Diagnosis not present

## 2017-05-12 DIAGNOSIS — F329 Major depressive disorder, single episode, unspecified: Secondary | ICD-10-CM | POA: Diagnosis not present

## 2017-08-16 DIAGNOSIS — R05 Cough: Secondary | ICD-10-CM | POA: Diagnosis not present

## 2017-08-16 DIAGNOSIS — J310 Chronic rhinitis: Secondary | ICD-10-CM | POA: Diagnosis not present

## 2017-10-07 ENCOUNTER — Inpatient Hospital Stay: Payer: PPO | Attending: Oncology

## 2017-10-07 ENCOUNTER — Encounter: Payer: Self-pay | Admitting: Oncology

## 2017-10-07 ENCOUNTER — Inpatient Hospital Stay (HOSPITAL_BASED_OUTPATIENT_CLINIC_OR_DEPARTMENT_OTHER): Payer: PPO | Admitting: Oncology

## 2017-10-07 VITALS — BP 150/84 | HR 77 | Temp 97.7°F | Resp 16 | Wt 164.0 lb

## 2017-10-07 DIAGNOSIS — K219 Gastro-esophageal reflux disease without esophagitis: Secondary | ICD-10-CM | POA: Diagnosis not present

## 2017-10-07 DIAGNOSIS — A0472 Enterocolitis due to Clostridium difficile, not specified as recurrent: Secondary | ICD-10-CM | POA: Insufficient documentation

## 2017-10-07 DIAGNOSIS — D729 Disorder of white blood cells, unspecified: Secondary | ICD-10-CM

## 2017-10-07 DIAGNOSIS — D72828 Other elevated white blood cell count: Secondary | ICD-10-CM

## 2017-10-07 DIAGNOSIS — F329 Major depressive disorder, single episode, unspecified: Secondary | ICD-10-CM | POA: Diagnosis not present

## 2017-10-07 DIAGNOSIS — J449 Chronic obstructive pulmonary disease, unspecified: Secondary | ICD-10-CM | POA: Insufficient documentation

## 2017-10-07 DIAGNOSIS — E785 Hyperlipidemia, unspecified: Secondary | ICD-10-CM | POA: Insufficient documentation

## 2017-10-07 DIAGNOSIS — K449 Diaphragmatic hernia without obstruction or gangrene: Secondary | ICD-10-CM | POA: Diagnosis not present

## 2017-10-07 DIAGNOSIS — D72820 Lymphocytosis (symptomatic): Secondary | ICD-10-CM | POA: Insufficient documentation

## 2017-10-07 DIAGNOSIS — Z79899 Other long term (current) drug therapy: Secondary | ICD-10-CM

## 2017-10-07 LAB — CBC WITH DIFFERENTIAL/PLATELET
BASOS ABS: 0 10*3/uL (ref 0–0.1)
Basophils Relative: 0 %
Eosinophils Absolute: 0.1 10*3/uL (ref 0–0.7)
Eosinophils Relative: 1 %
HEMATOCRIT: 41.7 % (ref 35.0–47.0)
Hemoglobin: 14 g/dL (ref 12.0–16.0)
LYMPHS ABS: 3.8 10*3/uL — AB (ref 1.0–3.6)
LYMPHS PCT: 33 %
MCH: 30.8 pg (ref 26.0–34.0)
MCHC: 33.6 g/dL (ref 32.0–36.0)
MCV: 91.5 fL (ref 80.0–100.0)
MONO ABS: 0.6 10*3/uL (ref 0.2–0.9)
Monocytes Relative: 5 %
NEUTROS ABS: 7.2 10*3/uL — AB (ref 1.4–6.5)
Neutrophils Relative %: 61 %
Platelets: 207 10*3/uL (ref 150–440)
RBC: 4.55 MIL/uL (ref 3.80–5.20)
RDW: 12.9 % (ref 11.5–14.5)
WBC: 11.8 10*3/uL — ABNORMAL HIGH (ref 3.6–11.0)

## 2017-10-07 NOTE — Progress Notes (Signed)
Patient here today for follow up with labs. She is feeling fairly well, except for a mild headache, which she has had since 9:00 this morning. She took one extra strength Tylenol at 9:00am. She states that she is still recovering from a terrible case of C-Diff from last year. She reports having a fecal transplant because it was so bad. Her energy level has not returned to normal. She states that she has a history of diverticulitis.

## 2017-10-07 NOTE — Progress Notes (Signed)
Hematology/Oncology Consult note [facility]  Telephone:([phone] [phone]  Patient Care Team: Juluis Pitch, MD as PCP - General (Family Medicine)   Name of the patient: Carly Hoffman  [id number]  [birth year]   Date of visit: 10/07/17  Diagnosis- leucocytosis likely reactive  Chief complaint/ Reason for visit- routine f/u of leucocytosis  Heme/Onc history: patient is a 66 year old Caucasian female with a history of C. difficile following a colonoscopy in May 2017. At that time her white count was normal. Since then patient had 2 more episodes of C. difficile and ultimately underwent a stool transplant by Dr. Darnell Level November 2017. Since November 2017 patient has had mild leukocytosis. In November 2017 her white count was 15 with predominant neutrophilia and lymphocytosis. H&H and platelet counts have always been normal. In December 2017 and most recently in March 2018 her white count has been around 12.4. Most recent CBC from 02/17/2017 showed white count of 12.4, H&H of 13.8/40.4 and a platelet count 208. Differential showed lymphocytosis with an absolute lymphocyte count of 4.6. Patient has not had any problems with diarrhea or cedar since his stool transplant. She otherwise feels well and denies other complaints today. Denies any other recurrent infections, appetite loss, unintentional weight loss and drenching night sweats.  Results of blood work from 03/23/2017 were as follows: CBC showed white count of 11.7, H&H of 13.5/38.8 and a platelet count of 230. Differential mainly showed neutrophilia and lymphocytosis peripheral flow cytometry did not reveal any evidence of monoclonal B-cell population. BCR able testing was negative for CML   Interval history-it has been almost a year since stool transplant for C. difficile.  Her energy levels are slowly getting better but still not at her baseline.  She has normal bowel movements on most days  but sometimes she feels that they are a little off.  ECOG PS- 0 Pain scale- 0   Review of systems- Review of Systems  Constitutional: Positive for malaise/fatigue. Negative for chills, fever and weight loss.  HENT: Negative for congestion, ear discharge and nosebleeds.   Eyes: Negative for blurred vision.  Respiratory: Negative for cough, hemoptysis, sputum production, shortness of breath and wheezing.   Cardiovascular: Negative for chest pain, palpitations, orthopnea and claudication.  Gastrointestinal: Negative for abdominal pain, blood in stool, constipation, diarrhea, heartburn, melena, nausea and vomiting.  Genitourinary: Negative for dysuria, flank pain, frequency, hematuria and urgency.  Musculoskeletal: Negative for back pain, joint pain and myalgias.  Skin: Negative for rash.  Neurological: Negative for dizziness, tingling, focal weakness, seizures, weakness and headaches.  Endo/Heme/Allergies: Does not bruise/bleed easily.  Psychiatric/Behavioral: Negative for depression and suicidal ideas. The patient does not have insomnia.       Allergies  Allergen Reactions  . Penicillins     G     Past Medical History:  Diagnosis Date  . Asthma   . Barrett esophagus   . Barrett's esophagus   . C. difficile diarrhea 03/2016   hospitalized in [place] x 4 d w c diff after colonoscopy   . COPD (chronic obstructive pulmonary disease) ([place])   . Depression   . Diarrhea   . Diverticulosis   . GERD (gastroesophageal reflux disease)   . H/O seasonal allergies   . Headache    migranes  . History of hiatal hernia   . Hyperlipidemia   . Osteoporosis, postmenopausal   . Shortness of breath dyspnea   . Varicose vein      Past Surgical History:  Procedure Laterality Date  . APPENDECTOMY    . COLONOSCOPY WITH ESOPHAGOGASTRODUODENOSCOPY (EGD)    . COLONOSCOPY WITH PROPOFOL N/A 03/30/2016   Procedure: COLONOSCOPY WITH PROPOFOL;  Surgeon: Lollie Sails, MD;  Location: [facility] ENDOSCOPY;   Service: Endoscopy;  Laterality: N/A;  . COLONOSCOPY WITH PROPOFOL N/A 09/25/2016   Procedure: COLONOSCOPY WITH PROPOFOL;  Surgeon: Manya Silvas, MD;  Location: [facility] ENDOSCOPY;  Service: Endoscopy;  Laterality: N/A;  . DILATION AND CURETTAGE OF UTERUS    . ESOPHAGOGASTRODUODENOSCOPY (EGD) WITH PROPOFOL N/A 03/30/2016   Procedure: ESOPHAGOGASTRODUODENOSCOPY (EGD) WITH PROPOFOL;  Surgeon: Lollie Sails, MD;  Location: [facility] ENDOSCOPY;  Service: Endoscopy;  Laterality: N/A;  . FECAL TRANSPLANT N/A 09/25/2016   Procedure: FECAL TRANSPLANT;  Surgeon: Manya Silvas, MD;  Location: [facility] ENDOSCOPY;  Service: Endoscopy;  Laterality: N/A;  . TONSILLECTOMY    . TUBAL LIGATION      Social History   Socioeconomic History  . Marital status: Divorced    Spouse name: Not on file  . Number of children: Not on file  . Years of education: Not on file  . Highest education level: Not on file  Social Needs  . Financial resource strain: Not on file  . Food insecurity - worry: Not on file  . Food insecurity - inability: Not on file  . Transportation needs - medical: Not on file  . Transportation needs - non-medical: Not on file  Occupational History  . Not on file  Tobacco Use  . Smoking status: Never Smoker  . Smokeless tobacco: Never Used  Substance and Sexual Activity  . Alcohol use: No  . Drug use: No  . Sexual activity: Yes  Other Topics Concern  . Not on file  Social History Narrative  . Not on file    Family History  Problem Relation Age of Onset  . Emphysema Mother   . Lung cancer Father   . Emphysema Father   . Atrial fibrillation Father   . Atrial fibrillation Brother      Current Outpatient Medications:  .  albuterol (PROVENTIL HFA;VENTOLIN HFA) 108 (90 Base) MCG/ACT inhaler, Inhale 2 puffs into the lungs every 6 (six) hours as needed for wheezing or shortness of breath., Disp: , Rfl:  .  ALPRAZolam (XANAX) 0.5 MG tablet, Take 0.5 mg by mouth at bedtime as needed for  anxiety., Disp: , Rfl:  .  Cholecalciferol 1000 units capsule, Take 1,000 Units by mouth daily., Disp: , Rfl:  .  cyclobenzaprine (FLEXERIL) 5 MG tablet, Take 5 mg by mouth 3 (three) times daily as needed for muscle spasms., Disp: , Rfl:  .  dicyclomine (BENTYL) 10 MG capsule, Take 10 mg by mouth 4 (four) times daily -  before meals and at bedtime., Disp: , Rfl:  .  fexofenadine (ALLEGRA) 180 MG tablet, Take 180 mg by mouth daily., Disp: , Rfl:  .  FLUoxetine (PROZAC) 20 MG capsule, Take 20 mg by mouth daily., Disp: , Rfl:  .  fluticasone (FLONASE) 50 MCG/ACT nasal spray, Place into the nose., Disp: , Rfl:  .  Fluticasone Furoate (ARNUITY ELLIPTA) 100 MCG/ACT AEPB, Inhale into the lungs., Disp: , Rfl:  .  HYDROcodone-acetaminophen (NORCO) 7.5-325 MG tablet, , Disp: , Rfl:  .  Multiple Vitamin (MULTIVITAMIN) tablet, Take 1 tablet by mouth daily., Disp: , Rfl:  .  omeprazole (PRILOSEC) 20 MG capsule, Take 20 mg by mouth daily., Disp: , Rfl:  .  ondansetron (ZOFRAN-ODT) 4 MG disintegrating tablet, Take 4  mg by mouth every 8 (eight) hours as needed for nausea or vomiting., Disp: , Rfl:  .  rizatriptan (MAXALT) 10 MG tablet, Take 10 mg by mouth as needed for migraine. May repeat in 2 hours if needed, Disp: , Rfl:  .  simvastatin (ZOCOR) 20 MG tablet, Take 20 mg by mouth daily., Disp: , Rfl:  .  sucralfate (CARAFATE) 1 g tablet, Take 1 g by mouth 2 (two) times daily. , Disp: , Rfl:  .  traZODone (DESYREL) 100 MG tablet, Take 100 mg by mouth at bedtime., Disp: , Rfl:   Physical exam:  Vitals:   10/07/17 1407  BP: (!) 150/84  Pulse: 77  Resp: 16  Temp: 97.7 F (36.5 C)  TempSrc: Tympanic  Weight: 164 lb (74.4 kg)   Physical Exam  Constitutional: She is oriented to person, place, and time and well-developed, well-nourished, and in no distress.  HENT:  Head: Normocephalic and atraumatic.  Eyes: EOM are normal. Pupils are equal, round, and reactive to light.  Neck: Normal range of motion.    Cardiovascular: Normal rate, regular rhythm and normal heart sounds.  Pulmonary/Chest: Effort normal and breath sounds normal.  Abdominal: Soft. Bowel sounds are normal.  Neurological: She is alert and oriented to person, place, and time.  Skin: Skin is warm and dry.     No flowsheet data found. CBC Latest Ref Rng & Units 10/07/2017  WBC 3.6 - 11.0 K/uL 11.8(H)  Hemoglobin 12.0 - 16.0 g/dL 14.0  Hematocrit 35.0 - 47.0 % 41.7  Platelets 150 - 440 K/uL 207      Assessment and plan- Patient is a 66 y.o. female referred for leukocytosis mainly neutrophilia and lymphocytosis  Patient's white count was 15 a year ago and slowly trending down.  Over the last 6 months her white count has been 11.7 and 11.8 respectively.  Differential mainly shows neutrophilia and lymphocytosis.  Peripheral flow cytometry was negative for any lymphoma or leukemia and BCR abl testing was negative as well.  At this time patient does not need any further workup for leukocytosis.  This can remain this way or wax and wane or may eventually normalize.  If she were to have consistent increase in her WBC count I will be happy to see her again.  Otherwise she can continue to follow-up with her primary care doctor   Visit Diagnosis 1. Neutrophilia   2. Lymphocytosis      Dr. Randa Evens, MD, MPH [facility] at [facility] Pager- [phone] 10/07/2017 4:01 PM

## 2017-11-09 DIAGNOSIS — J310 Chronic rhinitis: Secondary | ICD-10-CM | POA: Diagnosis not present

## 2017-11-09 DIAGNOSIS — R05 Cough: Secondary | ICD-10-CM | POA: Diagnosis not present

## 2017-11-09 DIAGNOSIS — J4520 Mild intermittent asthma, uncomplicated: Secondary | ICD-10-CM | POA: Diagnosis not present

## 2018-01-07 DIAGNOSIS — Z1231 Encounter for screening mammogram for malignant neoplasm of breast: Secondary | ICD-10-CM | POA: Diagnosis not present

## 2018-01-24 DIAGNOSIS — M810 Age-related osteoporosis without current pathological fracture: Secondary | ICD-10-CM | POA: Diagnosis not present

## 2018-01-24 DIAGNOSIS — Z23 Encounter for immunization: Secondary | ICD-10-CM | POA: Diagnosis not present

## 2018-01-24 DIAGNOSIS — K219 Gastro-esophageal reflux disease without esophagitis: Secondary | ICD-10-CM | POA: Diagnosis not present

## 2018-01-24 DIAGNOSIS — Z0000 Encounter for general adult medical examination without abnormal findings: Secondary | ICD-10-CM | POA: Diagnosis not present

## 2018-01-24 DIAGNOSIS — R7309 Other abnormal glucose: Secondary | ICD-10-CM | POA: Insufficient documentation

## 2018-01-24 DIAGNOSIS — E785 Hyperlipidemia, unspecified: Secondary | ICD-10-CM | POA: Diagnosis not present

## 2018-01-31 DIAGNOSIS — K219 Gastro-esophageal reflux disease without esophagitis: Secondary | ICD-10-CM | POA: Diagnosis not present

## 2018-01-31 DIAGNOSIS — E785 Hyperlipidemia, unspecified: Secondary | ICD-10-CM | POA: Diagnosis not present

## 2018-01-31 DIAGNOSIS — R7309 Other abnormal glucose: Secondary | ICD-10-CM | POA: Diagnosis not present

## 2018-03-24 DIAGNOSIS — R05 Cough: Secondary | ICD-10-CM | POA: Diagnosis not present

## 2018-03-24 DIAGNOSIS — J310 Chronic rhinitis: Secondary | ICD-10-CM | POA: Diagnosis not present

## 2018-03-24 DIAGNOSIS — J9801 Acute bronchospasm: Secondary | ICD-10-CM | POA: Diagnosis not present

## 2018-07-27 DIAGNOSIS — K22719 Barrett's esophagus with dysplasia, unspecified: Secondary | ICD-10-CM | POA: Diagnosis not present

## 2018-07-27 DIAGNOSIS — R7309 Other abnormal glucose: Secondary | ICD-10-CM | POA: Diagnosis not present

## 2018-07-27 DIAGNOSIS — E785 Hyperlipidemia, unspecified: Secondary | ICD-10-CM | POA: Diagnosis not present

## 2018-07-27 DIAGNOSIS — J3089 Other allergic rhinitis: Secondary | ICD-10-CM | POA: Diagnosis not present

## 2018-07-27 DIAGNOSIS — F329 Major depressive disorder, single episode, unspecified: Secondary | ICD-10-CM | POA: Diagnosis not present

## 2018-08-02 DIAGNOSIS — J4521 Mild intermittent asthma with (acute) exacerbation: Secondary | ICD-10-CM | POA: Diagnosis not present

## 2018-08-02 DIAGNOSIS — K219 Gastro-esophageal reflux disease without esophagitis: Secondary | ICD-10-CM | POA: Diagnosis not present

## 2018-08-02 DIAGNOSIS — J310 Chronic rhinitis: Secondary | ICD-10-CM | POA: Diagnosis not present

## 2018-08-25 DIAGNOSIS — L821 Other seborrheic keratosis: Secondary | ICD-10-CM | POA: Diagnosis not present

## 2018-08-25 DIAGNOSIS — D1800 Hemangioma unspecified site: Secondary | ICD-10-CM | POA: Diagnosis not present

## 2018-08-25 DIAGNOSIS — Q833 Accessory nipple: Secondary | ICD-10-CM | POA: Diagnosis not present

## 2018-08-25 DIAGNOSIS — Z1283 Encounter for screening for malignant neoplasm of skin: Secondary | ICD-10-CM | POA: Diagnosis not present

## 2018-08-25 DIAGNOSIS — L209 Atopic dermatitis, unspecified: Secondary | ICD-10-CM | POA: Diagnosis not present

## 2018-08-25 DIAGNOSIS — Z808 Family history of malignant neoplasm of other organs or systems: Secondary | ICD-10-CM | POA: Diagnosis not present

## 2018-08-25 DIAGNOSIS — D485 Neoplasm of uncertain behavior of skin: Secondary | ICD-10-CM | POA: Diagnosis not present

## 2018-08-25 DIAGNOSIS — L570 Actinic keratosis: Secondary | ICD-10-CM | POA: Diagnosis not present

## 2018-08-25 DIAGNOSIS — L820 Inflamed seborrheic keratosis: Secondary | ICD-10-CM | POA: Diagnosis not present

## 2018-11-09 DIAGNOSIS — L820 Inflamed seborrheic keratosis: Secondary | ICD-10-CM | POA: Diagnosis not present

## 2018-11-09 DIAGNOSIS — L821 Other seborrheic keratosis: Secondary | ICD-10-CM | POA: Diagnosis not present

## 2018-11-09 DIAGNOSIS — L609 Nail disorder, unspecified: Secondary | ICD-10-CM | POA: Diagnosis not present

## 2018-11-09 DIAGNOSIS — R21 Rash and other nonspecific skin eruption: Secondary | ICD-10-CM | POA: Diagnosis not present

## 2018-12-23 DIAGNOSIS — R05 Cough: Secondary | ICD-10-CM | POA: Diagnosis not present

## 2018-12-23 DIAGNOSIS — H10022 Other mucopurulent conjunctivitis, left eye: Secondary | ICD-10-CM | POA: Diagnosis not present

## 2018-12-23 DIAGNOSIS — J439 Emphysema, unspecified: Secondary | ICD-10-CM | POA: Diagnosis not present

## 2019-03-13 DIAGNOSIS — K219 Gastro-esophageal reflux disease without esophagitis: Secondary | ICD-10-CM | POA: Diagnosis not present

## 2019-03-13 DIAGNOSIS — R7309 Other abnormal glucose: Secondary | ICD-10-CM | POA: Diagnosis not present

## 2019-03-13 DIAGNOSIS — E785 Hyperlipidemia, unspecified: Secondary | ICD-10-CM | POA: Diagnosis not present

## 2019-03-13 DIAGNOSIS — F329 Major depressive disorder, single episode, unspecified: Secondary | ICD-10-CM | POA: Diagnosis not present

## 2019-03-13 DIAGNOSIS — J4530 Mild persistent asthma, uncomplicated: Secondary | ICD-10-CM | POA: Insufficient documentation

## 2019-03-13 DIAGNOSIS — T7840XD Allergy, unspecified, subsequent encounter: Secondary | ICD-10-CM | POA: Diagnosis not present

## 2019-03-20 DIAGNOSIS — J4530 Mild persistent asthma, uncomplicated: Secondary | ICD-10-CM | POA: Diagnosis not present

## 2019-03-20 DIAGNOSIS — E785 Hyperlipidemia, unspecified: Secondary | ICD-10-CM | POA: Diagnosis not present

## 2019-03-20 DIAGNOSIS — F329 Major depressive disorder, single episode, unspecified: Secondary | ICD-10-CM | POA: Diagnosis not present

## 2019-03-20 DIAGNOSIS — R7309 Other abnormal glucose: Secondary | ICD-10-CM | POA: Diagnosis not present

## 2019-03-20 DIAGNOSIS — K219 Gastro-esophageal reflux disease without esophagitis: Secondary | ICD-10-CM | POA: Diagnosis not present

## 2019-05-12 DIAGNOSIS — J4520 Mild intermittent asthma, uncomplicated: Secondary | ICD-10-CM | POA: Diagnosis not present

## 2019-06-14 DIAGNOSIS — H2513 Age-related nuclear cataract, bilateral: Secondary | ICD-10-CM | POA: Diagnosis not present

## 2019-07-03 DIAGNOSIS — T7840XD Allergy, unspecified, subsequent encounter: Secondary | ICD-10-CM | POA: Diagnosis not present

## 2019-07-03 DIAGNOSIS — Z124 Encounter for screening for malignant neoplasm of cervix: Secondary | ICD-10-CM | POA: Diagnosis not present

## 2019-07-03 DIAGNOSIS — G43009 Migraine without aura, not intractable, without status migrainosus: Secondary | ICD-10-CM | POA: Diagnosis not present

## 2019-07-03 DIAGNOSIS — Z1331 Encounter for screening for depression: Secondary | ICD-10-CM | POA: Diagnosis not present

## 2019-07-03 DIAGNOSIS — E785 Hyperlipidemia, unspecified: Secondary | ICD-10-CM | POA: Diagnosis not present

## 2019-07-03 DIAGNOSIS — Z0000 Encounter for general adult medical examination without abnormal findings: Secondary | ICD-10-CM | POA: Diagnosis not present

## 2019-07-03 DIAGNOSIS — F329 Major depressive disorder, single episode, unspecified: Secondary | ICD-10-CM | POA: Diagnosis not present

## 2019-07-03 DIAGNOSIS — J4530 Mild persistent asthma, uncomplicated: Secondary | ICD-10-CM | POA: Diagnosis not present

## 2019-07-03 DIAGNOSIS — K22719 Barrett's esophagus with dysplasia, unspecified: Secondary | ICD-10-CM | POA: Diagnosis not present

## 2019-07-12 DIAGNOSIS — Z1231 Encounter for screening mammogram for malignant neoplasm of breast: Secondary | ICD-10-CM | POA: Diagnosis not present

## 2019-07-20 DIAGNOSIS — I781 Nevus, non-neoplastic: Secondary | ICD-10-CM | POA: Diagnosis not present

## 2019-09-29 DIAGNOSIS — K2270 Barrett's esophagus without dysplasia: Secondary | ICD-10-CM | POA: Diagnosis not present

## 2019-09-29 DIAGNOSIS — Z86010 Personal history of colonic polyps: Secondary | ICD-10-CM | POA: Diagnosis not present

## 2019-10-04 DIAGNOSIS — L603 Nail dystrophy: Secondary | ICD-10-CM | POA: Diagnosis not present

## 2019-10-04 DIAGNOSIS — B079 Viral wart, unspecified: Secondary | ICD-10-CM | POA: Diagnosis not present

## 2019-10-04 DIAGNOSIS — Z1283 Encounter for screening for malignant neoplasm of skin: Secondary | ICD-10-CM | POA: Diagnosis not present

## 2019-10-04 DIAGNOSIS — D1800 Hemangioma unspecified site: Secondary | ICD-10-CM | POA: Diagnosis not present

## 2019-10-04 DIAGNOSIS — I781 Nevus, non-neoplastic: Secondary | ICD-10-CM | POA: Diagnosis not present

## 2019-10-04 DIAGNOSIS — L821 Other seborrheic keratosis: Secondary | ICD-10-CM | POA: Diagnosis not present

## 2019-10-04 DIAGNOSIS — Z808 Family history of malignant neoplasm of other organs or systems: Secondary | ICD-10-CM | POA: Diagnosis not present

## 2019-10-04 DIAGNOSIS — L570 Actinic keratosis: Secondary | ICD-10-CM | POA: Diagnosis not present

## 2019-10-04 DIAGNOSIS — L814 Other melanin hyperpigmentation: Secondary | ICD-10-CM | POA: Diagnosis not present

## 2019-10-06 ENCOUNTER — Other Ambulatory Visit
Admission: RE | Admit: 2019-10-06 | Discharge: 2019-10-06 | Disposition: A | Discharge status: Home / Self Care | Payer: PPO | Source: Ambulatory Visit | Attending: General Surgery | Admitting: General Surgery

## 2019-10-06 ENCOUNTER — Other Ambulatory Visit: Payer: Self-pay

## 2019-10-06 DIAGNOSIS — Z01812 Encounter for preprocedural laboratory examination: Secondary | ICD-10-CM | POA: Insufficient documentation

## 2019-10-06 DIAGNOSIS — Z20828 Contact with and (suspected) exposure to other viral communicable diseases: Secondary | ICD-10-CM | POA: Diagnosis not present

## 2019-10-06 DIAGNOSIS — Z86010 Personal history of colonic polyps: Secondary | ICD-10-CM | POA: Diagnosis not present

## 2019-10-06 DIAGNOSIS — M25512 Pain in left shoulder: Secondary | ICD-10-CM | POA: Diagnosis not present

## 2019-10-06 DIAGNOSIS — F329 Major depressive disorder, single episode, unspecified: Secondary | ICD-10-CM | POA: Diagnosis not present

## 2019-10-07 LAB — SARS CORONAVIRUS 2 (TAT 6-24 HRS): SARS Coronavirus 2: NEGATIVE

## 2019-10-11 ENCOUNTER — Other Ambulatory Visit: Payer: Self-pay

## 2019-10-11 ENCOUNTER — Encounter
Admission: RE | Disposition: A | Discharge status: Home / Self Care | Payer: Self-pay | Source: Home / Self Care | Attending: General Surgery

## 2019-10-11 ENCOUNTER — Ambulatory Visit: Payer: PPO | Admitting: Anesthesiology

## 2019-10-11 ENCOUNTER — Ambulatory Visit
Admission: RE | Admit: 2019-10-11 | Discharge: 2019-10-11 | Disposition: A | Discharge status: Home / Self Care | Payer: PPO | Attending: General Surgery | Admitting: General Surgery

## 2019-10-11 ENCOUNTER — Encounter: Payer: Self-pay | Admitting: *Deleted

## 2019-10-11 DIAGNOSIS — K2270 Barrett's esophagus without dysplasia: Secondary | ICD-10-CM | POA: Diagnosis not present

## 2019-10-11 DIAGNOSIS — Z8719 Personal history of other diseases of the digestive system: Secondary | ICD-10-CM | POA: Insufficient documentation

## 2019-10-11 DIAGNOSIS — E785 Hyperlipidemia, unspecified: Secondary | ICD-10-CM | POA: Insufficient documentation

## 2019-10-11 DIAGNOSIS — F329 Major depressive disorder, single episode, unspecified: Secondary | ICD-10-CM | POA: Insufficient documentation

## 2019-10-11 DIAGNOSIS — G43909 Migraine, unspecified, not intractable, without status migrainosus: Secondary | ICD-10-CM | POA: Insufficient documentation

## 2019-10-11 DIAGNOSIS — K2950 Unspecified chronic gastritis without bleeding: Secondary | ICD-10-CM | POA: Insufficient documentation

## 2019-10-11 DIAGNOSIS — K209 Esophagitis, unspecified without bleeding: Secondary | ICD-10-CM | POA: Diagnosis not present

## 2019-10-11 DIAGNOSIS — Q858 Other phakomatoses, not elsewhere classified: Secondary | ICD-10-CM | POA: Diagnosis not present

## 2019-10-11 DIAGNOSIS — Z7951 Long term (current) use of inhaled steroids: Secondary | ICD-10-CM | POA: Diagnosis not present

## 2019-10-11 DIAGNOSIS — K317 Polyp of stomach and duodenum: Secondary | ICD-10-CM | POA: Diagnosis not present

## 2019-10-11 DIAGNOSIS — K2100 Gastro-esophageal reflux disease with esophagitis, without bleeding: Secondary | ICD-10-CM | POA: Insufficient documentation

## 2019-10-11 DIAGNOSIS — M810 Age-related osteoporosis without current pathological fracture: Secondary | ICD-10-CM | POA: Diagnosis not present

## 2019-10-11 DIAGNOSIS — K449 Diaphragmatic hernia without obstruction or gangrene: Secondary | ICD-10-CM | POA: Diagnosis not present

## 2019-10-11 DIAGNOSIS — D372 Neoplasm of uncertain behavior of small intestine: Secondary | ICD-10-CM | POA: Diagnosis not present

## 2019-10-11 DIAGNOSIS — Z79899 Other long term (current) drug therapy: Secondary | ICD-10-CM | POA: Diagnosis not present

## 2019-10-11 DIAGNOSIS — D132 Benign neoplasm of duodenum: Secondary | ICD-10-CM | POA: Diagnosis not present

## 2019-10-11 HISTORY — PX: ESOPHAGOGASTRODUODENOSCOPY (EGD) WITH PROPOFOL: SHX5813

## 2019-10-11 SURGERY — ESOPHAGOGASTRODUODENOSCOPY (EGD) WITH PROPOFOL
Anesthesia: General

## 2019-10-11 MED ORDER — LIDOCAINE HCL (CARDIAC) PF 100 MG/5ML IV SOSY
PREFILLED_SYRINGE | INTRAVENOUS | Status: DC | PRN
  Administered 2019-10-11: 100 mg via INTRAVENOUS

## 2019-10-11 MED ORDER — GLYCOPYRROLATE 0.2 MG/ML IJ SOLN
INTRAMUSCULAR | Status: DC | PRN
  Administered 2019-10-11: 0.2 mg via INTRAVENOUS

## 2019-10-11 MED ORDER — PROPOFOL 10 MG/ML IV BOLUS
INTRAVENOUS | Status: DC | PRN
  Administered 2019-10-11 (×2): 10 mg via INTRAVENOUS
  Administered 2019-10-11: 20 mg via INTRAVENOUS
  Administered 2019-10-11 (×5): 10 mg via INTRAVENOUS
  Administered 2019-10-11: 20 mg via INTRAVENOUS
  Administered 2019-10-11 (×2): 10 mg via INTRAVENOUS
  Administered 2019-10-11: 70 mg via INTRAVENOUS

## 2019-10-11 MED ORDER — SODIUM CHLORIDE 0.9 % IV SOLN
INTRAVENOUS | Status: DC
  Administered 2019-10-11: 10:00:00 via INTRAVENOUS

## 2019-10-11 NOTE — Anesthesia Preprocedure Evaluation (Addendum)
Anesthesia Evaluation  Patient identified by MRN, date of birth, ID band Patient awake    Reviewed: Allergy & Precautions, H&P , NPO status , Patient's Chart, lab work & pertinent test results  Airway Mallampati: II  TM Distance: >3 FB Neck ROM: full    Dental  (+) Teeth Intact   Pulmonary shortness of breath, asthma , COPD,           Cardiovascular negative cardio ROS       Neuro/Psych  Headaches, PSYCHIATRIC DISORDERS Depression    GI/Hepatic Neg liver ROS, hiatal hernia, GERD  ,  Endo/Other  negative endocrine ROS  Renal/GU negative Renal ROS  negative genitourinary   Musculoskeletal   Abdominal   Peds  Hematology negative hematology ROS (+)   Anesthesia Other Findings Past Medical History: No date: Asthma No date: Barrett esophagus No date: Barrett's esophagus 03/2016: C. difficile diarrhea     Comment:  hospitalized in [place] x 4 d w c diff after colonoscopy  No date: COPD (chronic obstructive pulmonary disease) ([facility]) No date: Depression No date: Diarrhea No date: Diverticulosis No date: GERD (gastroesophageal reflux disease) No date: H/O seasonal allergies No date: Headache     Comment:  migranes No date: History of hiatal hernia No date: Hyperlipidemia No date: Osteoporosis, postmenopausal No date: Shortness of breath dyspnea No date: Varicose vein  Past Surgical History: No date: APPENDECTOMY No date: COLONOSCOPY WITH ESOPHAGOGASTRODUODENOSCOPY (EGD) 03/30/2016: COLONOSCOPY WITH PROPOFOL; N/A     Comment:  Procedure: COLONOSCOPY WITH PROPOFOL;  Surgeon: Lollie Sails, MD;  Location: [facility] ENDOSCOPY;  Service:               Endoscopy;  Laterality: N/A; 09/25/2016: COLONOSCOPY WITH PROPOFOL; N/A     Comment:  Procedure: COLONOSCOPY WITH PROPOFOL;  Surgeon: Manya Silvas, MD;  Location: [facility] ENDOSCOPY;  Service:               Endoscopy;  Laterality: N/A; No date:  DILATION AND CURETTAGE OF UTERUS 03/30/2016: ESOPHAGOGASTRODUODENOSCOPY (EGD) WITH PROPOFOL; N/A     Comment:  Procedure: ESOPHAGOGASTRODUODENOSCOPY (EGD) WITH               PROPOFOL;  Surgeon: Lollie Sails, MD;  Location:               [facility] ENDOSCOPY;  Service: Endoscopy;  Laterality: N/A; 09/25/2016: FECAL TRANSPLANT; N/A     Comment:  Procedure: FECAL TRANSPLANT;  Surgeon: Manya Silvas,              MD;  Location: [facility] ENDOSCOPY;  Service: Endoscopy;                Laterality: N/A; No date: TONSILLECTOMY No date: TUBAL LIGATION  BMI    Body Mass Index: 26.96 kg/m      Reproductive/Obstetrics negative OB ROS                            Anesthesia Physical Anesthesia Plan  ASA: II  Anesthesia Plan: General   Post-op Pain Management:    Induction:   PONV Risk Score and Plan: Propofol infusion and TIVA  Airway Management Planned: Natural Airway and Nasal Cannula  Additional Equipment:   Intra-op Plan:   Post-operative Plan:   Informed Consent: I have reviewed the patients History  and Physical, chart, labs and discussed the procedure including the risks, benefits and alternatives for the proposed anesthesia with the patient or authorized representative who has indicated his/her understanding and acceptance.     Dental Advisory Given  Plan Discussed with: Anesthesiologist, CRNA and Surgeon  Anesthesia Plan Comments:         Anesthesia Quick Evaluation

## 2019-10-11 NOTE — H&P (Signed)
Carly Hoffman [id number] 01/11/1951      HPI:  68 year old with a history of Barrett's epithelial changes. Mild symptoms of dysphagia with bread, rice and dry chicken.  No emesis/ full regurgitation.   For follow endoscopy.   Medications Prior to Admission  Medication Sig Dispense Refill Last Dose  . albuterol (PROVENTIL HFA;VENTOLIN HFA) 108 (90 Base) MCG/ACT inhaler Inhale 2 puffs into the lungs every 6 (six) hours as needed for wheezing or shortness of breath.   10/10/2019 at 1200  . ALPRAZolam (XANAX) 0.5 MG tablet Take 0.5 mg by mouth 2 (two) times daily as needed for anxiety (Avoid daily use).    Past Week at Unknown time  . benzonatate (TESSALON) 100 MG capsule Take 100 mg by mouth 3 (three) times daily as needed for cough.     . Cholecalciferol 1000 units capsule Take 1,000 Units by mouth daily.   Past Week at Unknown time  . cyclobenzaprine (FLEXERIL) 5 MG tablet Take 5 mg by mouth 3 (three) times daily as needed for muscle spasms.   Past Week at Unknown time  . fexofenadine (ALLEGRA) 180 MG tablet Take 180 mg by mouth daily.   10/10/2019 at 0800  . FLUoxetine (PROZAC) 20 MG capsule Take 40 mg by mouth daily.    10/10/2019 at 0800  . fluticasone furoate-vilanterol (BREO ELLIPTA) 100-25 MCG/INH AEPB Inhale 1 puff into the lungs daily.   10/11/2019 at 0600  . HYDROcodone-acetaminophen (NORCO) 7.5-325 MG tablet 1 tablet every 6 (six) hours as needed (Do not use every day).    Past Week at Unknown time  . montelukast (SINGULAIR) 10 MG tablet Take 10 mg by mouth at bedtime.   10/10/2019 at 2200  . Multiple Vitamin (MULTIVITAMIN) tablet Take 1 tablet by mouth daily.   Past Week at Unknown time  . Omega-3 Fatty Acids (FISH OIL) 1000 MG CAPS Take by mouth.   Past Week at Unknown time  . omeprazole (PRILOSEC) 20 MG capsule Take 20 mg by mouth daily.   Past Week at Unknown time  . ondansetron (ZOFRAN-ODT) 4 MG disintegrating tablet Take 4 mg by mouth every 8 (eight) hours as needed for  nausea or vomiting.   Past Week at Unknown time  . rizatriptan (MAXALT) 10 MG tablet Take 10 mg by mouth as needed for migraine. May repeat in 2 hours if needed   Past Week at Unknown time  . simvastatin (ZOCOR) 20 MG tablet Take 20 mg by mouth daily.   10/10/2019 at 0800  . traZODone (DESYREL) 100 MG tablet Take 100 mg by mouth at bedtime.   10/10/2019 at 2200  . dicyclomine (BENTYL) 10 MG capsule Take 10 mg by mouth 4 (four) times daily -  before meals and at bedtime.     . Fluticasone Furoate (ARNUITY ELLIPTA) 100 MCG/ACT AEPB Inhale into the lungs.     . sucralfate (CARAFATE) 1 g tablet Take 1 g by mouth 2 (two) times daily.       Allergies  Allergen Reactions  . Penicillins     G   Past Medical History:  Diagnosis Date  . Asthma   . Barrett esophagus   . Barrett's esophagus   . C. difficile diarrhea 03/2016   hospitalized in [place] x 4 d w c diff after colonoscopy   . COPD (chronic obstructive pulmonary disease) ([place])   . Depression   . Diarrhea   . Diverticulosis   . GERD (gastroesophageal reflux disease)   .  H/O seasonal allergies   . Headache    migranes  . History of hiatal hernia   . Hyperlipidemia   . Osteoporosis, postmenopausal   . Shortness of breath dyspnea   . Varicose vein    Past Surgical History:  Procedure Laterality Date  . APPENDECTOMY    . COLONOSCOPY WITH ESOPHAGOGASTRODUODENOSCOPY (EGD)    . COLONOSCOPY WITH PROPOFOL N/A 03/30/2016   Procedure: COLONOSCOPY WITH PROPOFOL;  Surgeon: Lollie Sails, MD;  Location: [facility] ENDOSCOPY;  Service: Endoscopy;  Laterality: N/A;  . COLONOSCOPY WITH PROPOFOL N/A 09/25/2016   Procedure: COLONOSCOPY WITH PROPOFOL;  Surgeon: Manya Silvas, MD;  Location: [facility] ENDOSCOPY;  Service: Endoscopy;  Laterality: N/A;  . DILATION AND CURETTAGE OF UTERUS    . ESOPHAGOGASTRODUODENOSCOPY (EGD) WITH PROPOFOL N/A 03/30/2016   Procedure: ESOPHAGOGASTRODUODENOSCOPY (EGD) WITH PROPOFOL;  Surgeon: Lollie Sails, MD;  Location: [facility]  ENDOSCOPY;  Service: Endoscopy;  Laterality: N/A;  . 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 TONSILLECTOMY    . TUBAL LIGATION     Social History   Socioeconomic History  . Marital status: Divorced    Spouse name: Not on file  . Number of children: Not on file  . Years of education: Not on file  . Highest education level: Not on file  Occupational History  . Not on file  Social Needs  . Financial resource strain: Not on file  . Food insecurity    Worry: Not on file    Inability: Not on file  . Transportation needs    Medical: Not on file    Non-medical: Not on file  Tobacco Use  . Smoking status: Never Smoker  . Smokeless tobacco: Never Used  Substance and Sexual Activity  . Alcohol use: No  . Drug use: No  . Sexual activity: Yes  Lifestyle  . Physical activity    Days per week: Not on file    Minutes per session: Not on file  . Stress: Not on file  Relationships  . Social Herbalist on phone: Not on file    Gets together: Not on file    Attends religious service: Not on file    Active member of club or organization: Not on file    Attends meetings of clubs or organizations: Not on file    Relationship status: Not on file  . Intimate partner violence    Fear of current or ex partner: Not on file    Emotionally abused: Not on file    Physically abused: Not on file    Forced sexual activity: Not on file  Other Topics Concern  . Not on file  Social History Narrative  . Not on file   Social History   Social History Narrative  . Not on file     ROS: Negative.     PE: HEENT: Negative. Lungs: Clear. Cardio: RR.   Assessment/Plan:  Proceed with planned endoscopy. Forest Gleason Admire B[place]ell 10/11/2019

## 2019-10-11 NOTE — Anesthesia Post-op Follow-up Note (Signed)
Anesthesia QCDR form completed.        

## 2019-10-11 NOTE — Transfer of Care (Signed)
Immediate Anesthesia Transfer of Care Note  Patient: Carly Hoffman  Procedure(s) Performed: ESOPHAGOGASTRODUODENOSCOPY (EGD) WITH PROPOFOL (N/A )  Patient Location: PACU  Anesthesia Type:General  Level of Consciousness: sedated  Airway & Oxygen Therapy: Patient Spontanous Breathing and Patient connected to nasal cannula oxygen  Post-op Assessment: Report given to RN and Post -op Vital signs reviewed and stable  Post vital signs: Reviewed and stable  Last Vitals:  Vitals Value Taken Time  BP 126/55 10/11/19 1047  Temp 36.4 C 10/11/19 1047  Pulse 74 10/11/19 1047  Resp 29 10/11/19 1047  SpO2 93 % 10/11/19 1047  Vitals shown include unvalidated device data.  Last Pain:  Vitals:   10/11/19 1044  TempSrc: Temporal  PainSc: Asleep         Complications: No apparent anesthesia complications

## 2019-10-11 NOTE — Anesthesia Postprocedure Evaluation (Signed)
Anesthesia Post Note  Patient: Carly Hoffman  Procedure(s) Performed: ESOPHAGOGASTRODUODENOSCOPY (EGD) WITH PROPOFOL (N/A )  Patient location during evaluation: PACU Anesthesia Type: General Level of consciousness: awake and alert Pain management: pain level controlled Vital Signs Assessment: post-procedure vital signs reviewed and stable Respiratory status: spontaneous breathing, nonlabored ventilation and respiratory function stable Cardiovascular status: blood pressure returned to baseline and stable Postop Assessment: no apparent nausea or vomiting Anesthetic complications: no     Last Vitals:  Vitals:   10/11/19 1054 10/11/19 1104  BP:  139/79  Pulse:    Resp:    Temp:    SpO2: 97%     Last Pain:  Vitals:   10/11/19 1104  TempSrc:   PainSc: 0-No pain                 Durenda Hurt

## 2019-10-11 NOTE — Op Note (Signed)
[facility] Patient Name: Carly Hoffman Procedure Date: 10/11/2019 10:16 AM MRN: [id number] Account #: [id number] Date of Birth: 12-18-1950 Admit Type: Outpatient Age: 68 Room: [facility] ENDO ROOM 1 Gender: Female Note Status: Finalized Procedure:             Upper GI endoscopy Indications:           Follow-up of Barrett's esophagus Providers:             Robert Bellow, MD Medicines:             Monitored Anesthesia Care Complications:         No immediate complications. Procedure:             Pre-Anesthesia Assessment:                        - Prior to the procedure, a History and Physical was                         performed, and patient medications, allergies and                         sensitivities were reviewed. The patient's tolerance                         of previous anesthesia was reviewed.                        - The risks and benefits of the procedure and the                         sedation options and risks were discussed with the                         patient. All questions were answered and informed                         consent was obtained.                        After obtaining informed consent, the endoscope was                         passed under direct vision. Throughout the procedure,                         the patient's blood pressure, pulse, and oxygen                         saturations were monitored continuously. The Endoscope                         was introduced through the mouth, and advanced to the                         third part of duodenum. The upper GI endoscopy was                         accomplished without difficulty. The patient tolerated  the procedure well. Findings:      LA Grade A (one or more mucosal breaks less than 5 mm, not extending       between tops of 2 mucosal folds) esophagitis with no bleeding was found       38 to 39 cm from the incisors. Mucosa was  biopsied with a cold forceps       for histology in 3 sections at intervals of 2 cm at 37 and 39 cm from       the incisors and at the gastroesophageal junction. A total of 2 specimen       bottles were sent to pathology.      Patchy mild inflammation was found in the prepyloric region of the       stomach.      A single 5 mm sessile polyp with no bleeding was found in the second       portion of the duodenum. Biopsies were taken with a cold forceps for       histology. Impression:            - LA Grade A reflux esophagitis with no bleeding. Rule                         out Barrett's esophagus. Biopsied.                        - Chronic gastritis.                        - A single duodenal polyp. Biopsied. Recommendation:        - Telephone endoscopist for pathology results in 1                         week. Procedure Code(s):     --- Professional ---                        [phone], Esophagogastroduodenoscopy, flexible,                         transoral; with biopsy, single or multiple Diagnosis Code(s):     --- Professional ---                        K21.00                        K29.50, Unspecified chronic gastritis without bleeding                        K31.7, Polyp of stomach and duodenum                        K22.70, Barrett's esophagus without dysplasia CPT copyright 2019 American Medical Association. All rights reserved. The codes documented in this report are preliminary and upon coder review may  be revised to meet current compliance requirements. Robert Bellow, MD 10/11/2019 10:46:18 AM This report has been signed electronically. Number of Addenda: 0 Note Initiated On: 10/11/2019 10:16 AM Estimated Blood Loss:  Estimated blood loss: none.      [facility]

## 2019-10-12 ENCOUNTER — Encounter: Payer: Self-pay | Admitting: General Surgery

## 2019-10-16 LAB — SURGICAL PATHOLOGY

## 2019-11-08 DIAGNOSIS — B070 Plantar wart: Secondary | ICD-10-CM | POA: Diagnosis not present

## 2019-11-08 DIAGNOSIS — L820 Inflamed seborrheic keratosis: Secondary | ICD-10-CM | POA: Diagnosis not present

## 2019-11-09 DIAGNOSIS — J449 Chronic obstructive pulmonary disease, unspecified: Secondary | ICD-10-CM | POA: Diagnosis not present

## 2019-12-01 DIAGNOSIS — K2100 Gastro-esophageal reflux disease with esophagitis, without bleeding: Secondary | ICD-10-CM | POA: Diagnosis not present

## 2019-12-01 DIAGNOSIS — K2270 Barrett's esophagus without dysplasia: Secondary | ICD-10-CM | POA: Diagnosis not present

## 2019-12-01 DIAGNOSIS — K2950 Unspecified chronic gastritis without bleeding: Secondary | ICD-10-CM | POA: Diagnosis not present

## 2019-12-28 DIAGNOSIS — L821 Other seborrheic keratosis: Secondary | ICD-10-CM | POA: Diagnosis not present

## 2019-12-28 DIAGNOSIS — B079 Viral wart, unspecified: Secondary | ICD-10-CM | POA: Diagnosis not present

## 2020-01-17 DIAGNOSIS — G43009 Migraine without aura, not intractable, without status migrainosus: Secondary | ICD-10-CM | POA: Diagnosis not present

## 2020-01-17 DIAGNOSIS — F329 Major depressive disorder, single episode, unspecified: Secondary | ICD-10-CM | POA: Diagnosis not present

## 2020-01-17 DIAGNOSIS — E538 Deficiency of other specified B group vitamins: Secondary | ICD-10-CM | POA: Diagnosis not present

## 2020-01-17 DIAGNOSIS — E785 Hyperlipidemia, unspecified: Secondary | ICD-10-CM | POA: Diagnosis not present

## 2020-01-17 DIAGNOSIS — N390 Urinary tract infection, site not specified: Secondary | ICD-10-CM | POA: Diagnosis not present

## 2020-01-17 DIAGNOSIS — R7309 Other abnormal glucose: Secondary | ICD-10-CM | POA: Diagnosis not present

## 2020-01-17 DIAGNOSIS — K219 Gastro-esophageal reflux disease without esophagitis: Secondary | ICD-10-CM | POA: Diagnosis not present

## 2020-01-18 DIAGNOSIS — N390 Urinary tract infection, site not specified: Secondary | ICD-10-CM | POA: Diagnosis not present

## 2020-01-30 DIAGNOSIS — N390 Urinary tract infection, site not specified: Secondary | ICD-10-CM | POA: Diagnosis not present

## 2020-02-01 DIAGNOSIS — L84 Corns and callosities: Secondary | ICD-10-CM | POA: Diagnosis not present

## 2020-02-01 DIAGNOSIS — L539 Erythematous condition, unspecified: Secondary | ICD-10-CM | POA: Diagnosis not present

## 2020-02-01 DIAGNOSIS — B070 Plantar wart: Secondary | ICD-10-CM | POA: Diagnosis not present

## 2020-02-05 ENCOUNTER — Telehealth: Payer: Self-pay

## 2020-02-06 NOTE — Telephone Encounter (Signed)
done

## 2020-03-08 ENCOUNTER — Other Ambulatory Visit: Payer: Self-pay

## 2020-03-08 ENCOUNTER — Ambulatory Visit: Payer: PPO | Admitting: Dermatology

## 2020-03-08 DIAGNOSIS — B079 Viral wart, unspecified: Secondary | ICD-10-CM | POA: Diagnosis not present

## 2020-03-08 NOTE — Patient Instructions (Addendum)
WartPEEL is a special compounded medicine (5-fluorouracil/salicylic acid) used to treat warts. It works much faster than over the counter salicylic acid but is not paid for by insurance. It should be applied to the wart once a day and covered with a bandage. Care should be taken to avoid applying it to the normal skin surrounding the wart in order to avoid irritation. It should not be used by pregnant women.   WARTPEEL INSTRUCTIONS  WartPEEL is a medicine to treat warts that has been prescribed for you and can be filled only at a special compounding pharmacy. Please call the pharmacy below to fill your prescription. Once they confirm your address and payment, they will mail you the medicine. DO NOT USE THIS MEDICINE IF YOU ARE PREGNANT OR THINKING OF BECOMING PREGNANT.  [facility] Ph: [phone]   Must be dispensed in amber syringe to ensure quality of medication. PRIOR TO USING THIS MEDICATION, IT IS IMPORTANT TO INFORM YOUR PHYSICIAN IF YOU ARE PREGNANT OR THINKING OF BECOMING PREGNANT. **This medication was custom compounded for you based on the prescription orders of your physician.  How to use this medication: 1. Apply medication at bedtime. 2. Apply very small amount of medication to a flat plastic applicator. Use the applicator to apply a thin layer directly onto the wart. Use care to avoid applying the medicine to healthy skin. The medicine will break down healthy skin as well as the warts. 3. Cover the wart with the tape provided.  4. Put the cap back tightly on the syringe. 5. Wash hands after applying the medicine. NEVER put the WartPEEL in the mouth, nose or eyes. 6. Remove the occlusion in the morning and wash the area thoroughly.  7. In case of accidental ingestion or contact with the eye, nose or mouth, contact [place] or the local poison control.   What to expect: During the first few days of application the skin around the wart may swell and  become white. This will slough off with continued applications. Normal Dosage: The medication is applied once daily for a time determined by your physician. Storage Requirements: Store this medication at room temperature. Keep out of reach of children. PROTECT FROM LIGHT. Expiration Date: The medication is good for four months from the date made. Do not keep outdated medication. Side effects: Rash and irritation, if medication is applied to good skin. Cautions and Warnings: Only apply the medication to the warts. Do not apply to good skin. Keep away from children. Do not use on nose, eyes or the mouth.

## 2020-03-08 NOTE — Progress Notes (Signed)
   Follow-Up Visit   Subjective  Carly Hoffman is a 69 y.o. female who presents for the following: Warts.  Present for months at R heel x 6. Treated with LN2 with improvement. Prescribed WartPeel but patient has not started using it yet.   The following portions of the chart were reviewed this encounter and updated as appropriate: Tobacco  Allergies  Meds  Problems  Med Hx  Surg Hx  Fam Hx      Review of Systems: No other skin or systemic complaints.  Objective  Well appearing patient in no apparent distress; mood and affect are within normal limits.  A focused examination was performed including feet and toenails. Relevant physical exam findings are noted in the Assessment and Plan.  Objective  Right Plantar x 3, Left plantar foot x 5 (8): Verrucous papules   Assessment & Plan  Viral warts, unspecified type (8) Right Plantar x 3, Left plantar foot x 5  Discussed viral etiology and risk of spread.  Discussed option of cryotherapy or cantharone and that multiple treatments may be required to clear warts.  Discussed possible post-treatment dyspigmentation and risk of recurrence. Discussed options of topicals. Patient prefers WartPEEL (5-fluorouracil/salicylic acid)  WartPEEL is a special compounded medicine (5-fluorouracil/salicylic acid) used to treat warts. It works much faster than over the counter salicylic acid but is not paid for by insurance. It should be applied to the wart once a day and covered with a bandage. Care should be taken to avoid applying it to the normal skin surrounding the wart in order to avoid irritation. It should not be used by pregnant women.    Return for  3-4 months, TBSE.   Carly Hoffman, RMA, am acting as scribe for Forest Gleason, MD .  Documentation: I have reviewed the above documentation for accuracy and completeness, and I agree with the above.  Forest Gleason, MD

## 2020-03-14 ENCOUNTER — Encounter: Payer: Self-pay | Admitting: Dermatology

## 2020-03-14 ENCOUNTER — Telehealth: Payer: Self-pay | Admitting: Dermatology

## 2020-03-14 NOTE — Telephone Encounter (Signed)
Called patient with recommendations for psychiatry and counseling for under 70 years old.   Dr. Owens Shark at [facility] ([email or web address]) is reportedly taking new patients.   Dr. Lyndle Herrlich may also be taking new patients.   [facility] and [facility] waitlists were also recommended.   All questions answered.

## 2020-05-09 DIAGNOSIS — R519 Headache, unspecified: Secondary | ICD-10-CM | POA: Diagnosis not present

## 2020-05-09 DIAGNOSIS — J449 Chronic obstructive pulmonary disease, unspecified: Secondary | ICD-10-CM | POA: Diagnosis not present

## 2020-06-03 DIAGNOSIS — K5792 Diverticulitis of intestine, part unspecified, without perforation or abscess without bleeding: Secondary | ICD-10-CM | POA: Diagnosis not present

## 2020-06-03 DIAGNOSIS — Z8719 Personal history of other diseases of the digestive system: Secondary | ICD-10-CM | POA: Diagnosis not present

## 2020-06-03 DIAGNOSIS — R1031 Right lower quadrant pain: Secondary | ICD-10-CM | POA: Diagnosis not present

## 2020-06-03 DIAGNOSIS — R1032 Left lower quadrant pain: Secondary | ICD-10-CM | POA: Diagnosis not present

## 2020-06-06 DIAGNOSIS — K5792 Diverticulitis of intestine, part unspecified, without perforation or abscess without bleeding: Secondary | ICD-10-CM | POA: Diagnosis not present

## 2020-06-21 DIAGNOSIS — R197 Diarrhea, unspecified: Secondary | ICD-10-CM | POA: Diagnosis not present

## 2020-07-10 ENCOUNTER — Ambulatory Visit: Payer: PPO | Admitting: Dermatology

## 2020-07-10 ENCOUNTER — Encounter: Payer: Self-pay | Admitting: Dermatology

## 2020-07-10 ENCOUNTER — Other Ambulatory Visit: Payer: Self-pay

## 2020-07-10 DIAGNOSIS — Z1283 Encounter for screening for malignant neoplasm of skin: Secondary | ICD-10-CM | POA: Diagnosis not present

## 2020-07-10 DIAGNOSIS — D1800 Hemangioma unspecified site: Secondary | ICD-10-CM

## 2020-07-10 DIAGNOSIS — D229 Melanocytic nevi, unspecified: Secondary | ICD-10-CM

## 2020-07-10 DIAGNOSIS — D225 Melanocytic nevi of trunk: Secondary | ICD-10-CM | POA: Diagnosis not present

## 2020-07-10 DIAGNOSIS — Z808 Family history of malignant neoplasm of other organs or systems: Secondary | ICD-10-CM | POA: Diagnosis not present

## 2020-07-10 DIAGNOSIS — B079 Viral wart, unspecified: Secondary | ICD-10-CM | POA: Diagnosis not present

## 2020-07-10 DIAGNOSIS — L578 Other skin changes due to chronic exposure to nonionizing radiation: Secondary | ICD-10-CM | POA: Diagnosis not present

## 2020-07-10 DIAGNOSIS — L821 Other seborrheic keratosis: Secondary | ICD-10-CM

## 2020-07-10 DIAGNOSIS — L820 Inflamed seborrheic keratosis: Secondary | ICD-10-CM

## 2020-07-10 DIAGNOSIS — L814 Other melanin hyperpigmentation: Secondary | ICD-10-CM | POA: Diagnosis not present

## 2020-07-10 NOTE — Progress Notes (Signed)
Follow-Up Visit   Subjective  Carly Hoffman is a 69 y.o. female who presents for the following: TBSE.  Patient presents today for annual TBSE and skin cancer screening. Has no real concerns today. However does have on little itchy spot on her left elbow she would like to have evaluated and possible treated. Also has a h/o warts Patient states that she has no skin cancer h/o at this time, but has a family h/o MM.   Of note, her daughter in her late 62s was just diagnosed with small cell cancer and it is not yet known what type.   The following portions of the chart were reviewed this encounter and updated as appropriate:  Tobacco  Allergies  Meds  Problems  Med Hx  Surg Hx  Fam Hx      Review of Systems:  No other skin or systemic complaints except as noted in HPI or Assessment and Plan.  Objective  Well appearing patient in no apparent distress; mood and affect are within normal limits.  A full examination was performed including scalp, head, eyes, ears, nose, lips, neck, chest, axillae, abdomen, back, buttocks, bilateral upper extremities, bilateral lower extremities, hands, feet, fingers, toes, fingernails, and toenails. All findings within normal limits unless otherwise noted below.  Objective  Left Lateral Elbow: Erythematous keratotic or waxy stuck-on papule or plaque.   Objective  Left Medial Plantar Surface, Right Medial Plantar Surface: Verrucous papules  Objective  Left breast: 1.3 cm pink plaque   Assessment & Plan  Inflamed seborrheic keratosis Left Lateral Elbow  Cryotherapy today  Prior to procedure, discussed risks of blister formation, small wound, skin dyspigmentation, or rare scar following cryotherapy.    Destruction of lesion - Left Lateral Elbow  Destruction method: cryotherapy   Informed consent: discussed and consent obtained   Lesion destroyed using liquid nitrogen: Yes   Outcome: patient tolerated procedure well with no  complications   Post-procedure details: wound care instructions given    Viral warts, unspecified type (2) Left Medial Plantar Surface; Right Medial Plantar Surface  Prescribed 5-fluorouracil/salicylic acid previously but patient has not started using it yet. Reviewed instructions for use.   Discussed viral etiology and risk of spread.   WartPEEL is a special compounded medicine (5-fluorouracil/salicylic acid) used to treat warts. It works much faster than over the counter salicylic acid but is not paid for by insurance. It should be applied to the wart once a day and covered with a bandage. Care should be taken to avoid applying it to the normal skin surrounding the wart in order to avoid irritation. It should not be used by pregnant women.    Nevus Left breast  Benign-appearing.  Observation.  Call clinic for new or changing moles.  Recommend daily use of broad spectrum spf 30+ sunscreen to sun-exposed areas.     Lentigines - Scattered tan macules - Discussed due to sun exposure - Benign, observe - Call for any changes  Seborrheic Keratoses - Stuck-on, waxy, tan-brown papules and plaques  - Discussed benign etiology and prognosis. - Observe - Call for any changes  Melanocytic Nevi - Tan-brown and/or pink-flesh-colored symmetric macules and papules - Benign appearing on exam today - Observation - Call clinic for new or changing moles - Recommend daily use of broad spectrum spf 30+ sunscreen to sun-exposed areas.   Hemangiomas - Red papules - Discussed benign nature - Observe - Call for any changes  Actinic Damage - diffuse scaly erythematous macules with underlying  dyspigmentation - Recommend daily broad spectrum sunscreen SPF 30+ to sun-exposed areas, reapply every 2 hours as needed.  - Call for new or changing lesions.  Skin cancer screening performed today.   Return in about 1 year (around 07/10/2021) for TBSE.  I, Donzetta Kohut, CMA, am acting as scribe for  Forest Gleason, MD .  Documentation: I have reviewed the above documentation for accuracy and completeness, and I agree with the above.  Forest Gleason, MD

## 2020-07-10 NOTE — Patient Instructions (Addendum)
Recommend daily broad spectrum sunscreen SPF 30+ to sun-exposed areas, reapply every 2 hours as needed. Call for new or changing lesions.  Recommend taking Heliocare sun protection supplement daily in sunny weather for additional sun protection. For maximum protection on the sunniest days, you can take up to 2 capsules of regular Heliocare OR take 1 capsule of Heliocare Ultra. For prolonged exposure (such as a full day in the sun), you can repeat your dose of the supplement 4 hours after your first dose. Heliocare can be purchased at [facility] or at [email or web address].   

## 2020-08-01 DIAGNOSIS — J9811 Atelectasis: Secondary | ICD-10-CM | POA: Diagnosis not present

## 2020-08-01 DIAGNOSIS — R05 Cough: Secondary | ICD-10-CM | POA: Diagnosis not present

## 2020-10-10 ENCOUNTER — Encounter: Payer: PPO | Admitting: Dermatology

## 2020-10-23 DIAGNOSIS — Z8619 Personal history of other infectious and parasitic diseases: Secondary | ICD-10-CM | POA: Diagnosis not present

## 2021-01-01 DIAGNOSIS — R7309 Other abnormal glucose: Secondary | ICD-10-CM | POA: Diagnosis not present

## 2021-01-01 DIAGNOSIS — K219 Gastro-esophageal reflux disease without esophagitis: Secondary | ICD-10-CM | POA: Diagnosis not present

## 2021-01-01 DIAGNOSIS — M810 Age-related osteoporosis without current pathological fracture: Secondary | ICD-10-CM | POA: Diagnosis not present

## 2021-01-01 DIAGNOSIS — Z0000 Encounter for general adult medical examination without abnormal findings: Secondary | ICD-10-CM | POA: Diagnosis not present

## 2021-01-01 DIAGNOSIS — E785 Hyperlipidemia, unspecified: Secondary | ICD-10-CM | POA: Diagnosis not present

## 2021-01-01 DIAGNOSIS — F32A Depression, unspecified: Secondary | ICD-10-CM | POA: Diagnosis not present

## 2021-01-01 DIAGNOSIS — G43009 Migraine without aura, not intractable, without status migrainosus: Secondary | ICD-10-CM | POA: Diagnosis not present

## 2021-01-01 DIAGNOSIS — T7840XD Allergy, unspecified, subsequent encounter: Secondary | ICD-10-CM | POA: Diagnosis not present

## 2021-01-01 DIAGNOSIS — J4530 Mild persistent asthma, uncomplicated: Secondary | ICD-10-CM | POA: Diagnosis not present

## 2021-01-02 ENCOUNTER — Other Ambulatory Visit: Payer: Self-pay | Admitting: Family Medicine

## 2021-01-02 DIAGNOSIS — Z1231 Encounter for screening mammogram for malignant neoplasm of breast: Secondary | ICD-10-CM

## 2021-01-10 DIAGNOSIS — E785 Hyperlipidemia, unspecified: Secondary | ICD-10-CM | POA: Diagnosis not present

## 2021-01-10 DIAGNOSIS — R7309 Other abnormal glucose: Secondary | ICD-10-CM | POA: Diagnosis not present

## 2021-01-10 DIAGNOSIS — K219 Gastro-esophageal reflux disease without esophagitis: Secondary | ICD-10-CM | POA: Diagnosis not present

## 2021-01-10 DIAGNOSIS — E538 Deficiency of other specified B group vitamins: Secondary | ICD-10-CM | POA: Diagnosis not present

## 2021-01-27 DIAGNOSIS — Z1231 Encounter for screening mammogram for malignant neoplasm of breast: Secondary | ICD-10-CM | POA: Diagnosis not present

## 2021-04-11 ENCOUNTER — Other Ambulatory Visit
Admission: RE | Admit: 2021-04-11 | Discharge: 2021-04-11 | Disposition: A | Discharge status: Home / Self Care | Payer: PPO | Source: Ambulatory Visit | Attending: Specialist | Admitting: Specialist

## 2021-04-11 DIAGNOSIS — Z03818 Encounter for observation for suspected exposure to other biological agents ruled out: Secondary | ICD-10-CM | POA: Diagnosis not present

## 2021-04-11 DIAGNOSIS — R0902 Hypoxemia: Secondary | ICD-10-CM | POA: Insufficient documentation

## 2021-04-11 DIAGNOSIS — J4521 Mild intermittent asthma with (acute) exacerbation: Secondary | ICD-10-CM | POA: Diagnosis not present

## 2021-04-11 DIAGNOSIS — R059 Cough, unspecified: Secondary | ICD-10-CM | POA: Diagnosis not present

## 2021-04-11 DIAGNOSIS — R058 Other specified cough: Secondary | ICD-10-CM | POA: Diagnosis not present

## 2021-04-11 LAB — D-DIMER, QUANTITATIVE: D-Dimer, Quant: 0.31 ug/mL-FEU (ref 0.00–0.50)

## 2021-04-14 DIAGNOSIS — J449 Chronic obstructive pulmonary disease, unspecified: Secondary | ICD-10-CM | POA: Diagnosis not present

## 2021-04-14 DIAGNOSIS — J4521 Mild intermittent asthma with (acute) exacerbation: Secondary | ICD-10-CM | POA: Diagnosis not present

## 2021-04-17 DIAGNOSIS — R0602 Shortness of breath: Secondary | ICD-10-CM | POA: Diagnosis not present

## 2021-04-17 DIAGNOSIS — J4530 Mild persistent asthma, uncomplicated: Secondary | ICD-10-CM | POA: Diagnosis not present

## 2021-04-17 DIAGNOSIS — R059 Cough, unspecified: Secondary | ICD-10-CM | POA: Diagnosis not present

## 2021-04-24 DIAGNOSIS — K2270 Barrett's esophagus without dysplasia: Secondary | ICD-10-CM | POA: Diagnosis not present

## 2021-04-24 DIAGNOSIS — R1314 Dysphagia, pharyngoesophageal phase: Secondary | ICD-10-CM | POA: Diagnosis not present

## 2021-04-24 DIAGNOSIS — Z86010 Personal history of colonic polyps: Secondary | ICD-10-CM | POA: Diagnosis not present

## 2021-05-15 DIAGNOSIS — J449 Chronic obstructive pulmonary disease, unspecified: Secondary | ICD-10-CM | POA: Diagnosis not present

## 2021-05-15 DIAGNOSIS — J4521 Mild intermittent asthma with (acute) exacerbation: Secondary | ICD-10-CM | POA: Diagnosis not present

## 2021-05-20 DIAGNOSIS — R059 Cough, unspecified: Secondary | ICD-10-CM | POA: Diagnosis not present

## 2021-05-20 DIAGNOSIS — R0600 Dyspnea, unspecified: Secondary | ICD-10-CM | POA: Diagnosis not present

## 2021-05-20 DIAGNOSIS — R053 Chronic cough: Secondary | ICD-10-CM | POA: Diagnosis not present

## 2021-05-20 DIAGNOSIS — J439 Emphysema, unspecified: Secondary | ICD-10-CM | POA: Diagnosis not present

## 2021-05-20 DIAGNOSIS — J9811 Atelectasis: Secondary | ICD-10-CM | POA: Diagnosis not present

## 2021-05-20 DIAGNOSIS — J4530 Mild persistent asthma, uncomplicated: Secondary | ICD-10-CM | POA: Diagnosis not present

## 2021-05-27 DIAGNOSIS — J849 Interstitial pulmonary disease, unspecified: Secondary | ICD-10-CM | POA: Diagnosis not present

## 2021-05-28 ENCOUNTER — Other Ambulatory Visit: Payer: Self-pay | Admitting: Specialist

## 2021-05-28 DIAGNOSIS — J849 Interstitial pulmonary disease, unspecified: Secondary | ICD-10-CM

## 2021-05-29 ENCOUNTER — Other Ambulatory Visit: Payer: Self-pay

## 2021-05-29 ENCOUNTER — Ambulatory Visit
Admission: RE | Admit: 2021-05-29 | Discharge: 2021-05-29 | Disposition: A | Discharge status: Home / Self Care | Payer: PPO | Source: Ambulatory Visit | Attending: Specialist | Admitting: Specialist

## 2021-05-29 DIAGNOSIS — J849 Interstitial pulmonary disease, unspecified: Secondary | ICD-10-CM | POA: Diagnosis not present

## 2021-05-29 DIAGNOSIS — I700 Atherosclerosis of aorta: Secondary | ICD-10-CM | POA: Diagnosis not present

## 2021-05-29 DIAGNOSIS — J479 Bronchiectasis, uncomplicated: Secondary | ICD-10-CM | POA: Diagnosis not present

## 2021-06-05 ENCOUNTER — Ambulatory Visit: Payer: PPO

## 2021-06-14 DIAGNOSIS — J4521 Mild intermittent asthma with (acute) exacerbation: Secondary | ICD-10-CM | POA: Diagnosis not present

## 2021-06-14 DIAGNOSIS — J449 Chronic obstructive pulmonary disease, unspecified: Secondary | ICD-10-CM | POA: Diagnosis not present

## 2021-06-17 DIAGNOSIS — J209 Acute bronchitis, unspecified: Secondary | ICD-10-CM | POA: Diagnosis not present

## 2021-06-17 DIAGNOSIS — J449 Chronic obstructive pulmonary disease, unspecified: Secondary | ICD-10-CM | POA: Diagnosis not present

## 2021-06-17 DIAGNOSIS — R059 Cough, unspecified: Secondary | ICD-10-CM | POA: Diagnosis not present

## 2021-06-17 DIAGNOSIS — R0602 Shortness of breath: Secondary | ICD-10-CM | POA: Diagnosis not present

## 2021-06-19 DIAGNOSIS — R0602 Shortness of breath: Secondary | ICD-10-CM | POA: Diagnosis not present

## 2021-07-01 DIAGNOSIS — E785 Hyperlipidemia, unspecified: Secondary | ICD-10-CM | POA: Diagnosis not present

## 2021-07-01 DIAGNOSIS — F32A Depression, unspecified: Secondary | ICD-10-CM | POA: Diagnosis not present

## 2021-07-01 DIAGNOSIS — J4530 Mild persistent asthma, uncomplicated: Secondary | ICD-10-CM | POA: Diagnosis not present

## 2021-07-01 DIAGNOSIS — R7309 Other abnormal glucose: Secondary | ICD-10-CM | POA: Diagnosis not present

## 2021-07-01 DIAGNOSIS — G43009 Migraine without aura, not intractable, without status migrainosus: Secondary | ICD-10-CM | POA: Diagnosis not present

## 2021-07-01 DIAGNOSIS — K219 Gastro-esophageal reflux disease without esophagitis: Secondary | ICD-10-CM | POA: Diagnosis not present

## 2021-07-04 DIAGNOSIS — R053 Chronic cough: Secondary | ICD-10-CM | POA: Diagnosis not present

## 2021-07-04 DIAGNOSIS — D696 Thrombocytopenia, unspecified: Secondary | ICD-10-CM | POA: Diagnosis not present

## 2021-07-04 DIAGNOSIS — J439 Emphysema, unspecified: Secondary | ICD-10-CM | POA: Diagnosis not present

## 2021-07-04 DIAGNOSIS — R0609 Other forms of dyspnea: Secondary | ICD-10-CM | POA: Diagnosis not present

## 2021-07-04 DIAGNOSIS — Z9981 Dependence on supplemental oxygen: Secondary | ICD-10-CM | POA: Diagnosis not present

## 2021-07-15 ENCOUNTER — Inpatient Hospital Stay: Payer: PPO

## 2021-07-15 ENCOUNTER — Inpatient Hospital Stay: Payer: PPO | Attending: Oncology | Admitting: Oncology

## 2021-07-15 ENCOUNTER — Encounter: Payer: Self-pay | Admitting: Oncology

## 2021-07-15 VITALS — BP 136/65 | HR 75 | Temp 98.7°F | Resp 18 | Wt 157.0 lb

## 2021-07-15 DIAGNOSIS — J449 Chronic obstructive pulmonary disease, unspecified: Secondary | ICD-10-CM | POA: Diagnosis not present

## 2021-07-15 DIAGNOSIS — J4521 Mild intermittent asthma with (acute) exacerbation: Secondary | ICD-10-CM | POA: Diagnosis not present

## 2021-07-15 DIAGNOSIS — D696 Thrombocytopenia, unspecified: Secondary | ICD-10-CM

## 2021-07-15 HISTORY — DX: Thrombocytopenia, unspecified: D69.6

## 2021-07-15 LAB — CBC WITH DIFFERENTIAL/PLATELET
Abs Immature Granulocytes: 0.04 10*3/uL (ref 0.00–0.07)
Basophils Absolute: 0.1 10*3/uL (ref 0.0–0.1)
Basophils Relative: 1 %
Eosinophils Absolute: 0.3 10*3/uL (ref 0.0–0.5)
Eosinophils Relative: 2 %
HCT: 42.1 % (ref 36.0–46.0)
Hemoglobin: 13.9 g/dL (ref 12.0–15.0)
Immature Granulocytes: 0 %
Lymphocytes Relative: 26 %
Lymphs Abs: 2.9 10*3/uL (ref 0.7–4.0)
MCH: 29.9 pg (ref 26.0–34.0)
MCHC: 33 g/dL (ref 30.0–36.0)
MCV: 90.5 fL (ref 80.0–100.0)
Monocytes Absolute: 0.6 10*3/uL (ref 0.1–1.0)
Monocytes Relative: 6 %
Neutro Abs: 7.1 10*3/uL (ref 1.7–7.7)
Neutrophils Relative %: 65 %
Platelets: 120 10*3/uL — ABNORMAL LOW (ref 150–400)
RBC: 4.65 MIL/uL (ref 3.87–5.11)
RDW: 12.8 % (ref 11.5–15.5)
WBC: 11.1 10*3/uL — ABNORMAL HIGH (ref 4.0–10.5)
nRBC: 0 % (ref 0.0–0.2)

## 2021-07-15 LAB — TECHNOLOGIST SMEAR REVIEW: Plt Morphology: DECREASED

## 2021-07-15 LAB — HEPATITIS C ANTIBODY: HCV Ab: NONREACTIVE

## 2021-07-15 LAB — FOLATE: Folate: 19.9 ng/mL (ref >5.9)

## 2021-07-15 LAB — VITAMIN B12: Vitamin B-12: 386 pg/mL (ref 180–914)

## 2021-07-16 LAB — HIV ANTIBODY (ROUTINE TESTING W REFLEX): HIV Screen 4th Generation wRfx: NONREACTIVE

## 2021-07-17 ENCOUNTER — Ambulatory Visit: Payer: PPO | Admitting: Dermatology

## 2021-07-17 ENCOUNTER — Other Ambulatory Visit: Payer: Self-pay

## 2021-07-17 DIAGNOSIS — B070 Plantar wart: Secondary | ICD-10-CM

## 2021-07-17 DIAGNOSIS — H00015 Hordeolum externum left lower eyelid: Secondary | ICD-10-CM | POA: Diagnosis not present

## 2021-07-17 DIAGNOSIS — D485 Neoplasm of uncertain behavior of skin: Secondary | ICD-10-CM

## 2021-07-17 DIAGNOSIS — L570 Actinic keratosis: Secondary | ICD-10-CM

## 2021-07-17 DIAGNOSIS — Z1283 Encounter for screening for malignant neoplasm of skin: Secondary | ICD-10-CM | POA: Diagnosis not present

## 2021-07-17 NOTE — Progress Notes (Signed)
I connected with Carly Hoffman on 07/17/21 at  3:00 PM EDT by video enabled telemedicine visit and verified that I am speaking with the correct person using two identifiers.   I discussed the limitations, risks, security and privacy concerns of performing an evaluation and management service by telemedicine and the availability of in-person appointments. I also discussed with the patient that there may be a patient responsible charge related to this service. The patient expressed understanding and agreed to proceed.  Patient's location:  cancer center Provider's location:  home  Reason for referral: Thrombocytopenia Referring provider Dr. Raul Del  History of present illness: Patient is a 70 year old female who was last seen by me in 2018 for leukocytosis mainly neutrophilia which was thought to be reactive.  Flow cytometry and BCR ABL testing did not reveal any abnormality.  She was then asked to follow-up with her PCP.  Since then her white count has remained stably elevated between 11-18 and it waxes and wanes.  Differential is mainly showed neutrophilia and lymphocytosis with no clear rising trend.  On her recent blood work from 07/01/2021 patient noted to have a white count of 13.1, H&H of 14/42.9 and a platelet count of 100.  Prior to that her platelet count was normal at 185 in July 2021.  She has therefore been referred to [place] for thrombocytopenia.  Patient denies any over-the-counter herbal supplements.  No recent changes in her medications.  She denies any bleeding or bruising.  Overall patient feels well.  She does report some ongoing fatigue.  Denies any changes in her appetite or weight.    Review of Systems  Constitutional:  Positive for malaise/fatigue. Negative for chills, fever and weight loss.  HENT:  Negative for congestion, ear discharge and nosebleeds.   Eyes:  Negative for blurred vision.  Respiratory:  Negative for cough, hemoptysis, sputum production, shortness of breath and  wheezing.   Cardiovascular:  Negative for chest pain, palpitations, orthopnea and claudication.  Gastrointestinal:  Negative for abdominal pain, blood in stool, constipation, diarrhea, heartburn, melena, nausea and vomiting.  Genitourinary:  Negative for dysuria, flank pain, frequency, hematuria and urgency.  Musculoskeletal:  Negative for back pain, joint pain and myalgias.  Skin:  Negative for rash.  Neurological:  Negative for dizziness, tingling, focal weakness, seizures, weakness and headaches.  Endo/Heme/Allergies:  Does not bruise/bleed easily.  Psychiatric/Behavioral:  Negative for depression and suicidal ideas. The patient does not have insomnia.    Allergies  Allergen Reactions   Penicillins     G   Topiramate Nausea Only    Shaky, imbalnced   Penicillin G Rash    Past Medical History:  Diagnosis Date   Asthma    Barrett esophagus    Barrett's esophagus    C. difficile diarrhea 03/2016   hospitalized in [place] x 4 d w c diff after colonoscopy    COPD (chronic obstructive pulmonary disease) (HCC)    Depression    Diarrhea    Diverticulosis    GERD (gastroesophageal reflux disease)    H/O seasonal allergies    Headache    migranes   History of hiatal hernia    Hyperlipidemia    Osteoporosis, postmenopausal    Shortness of breath dyspnea    Thrombocytopenia ([place]) 07/15/2021   Possible   Varicose vein     Past Surgical History:  Procedure Laterality Date   APPENDECTOMY     COLONOSCOPY WITH ESOPHAGOGASTRODUODENOSCOPY (EGD)     COLONOSCOPY WITH PROPOFOL N/A 03/30/2016  Procedure: COLONOSCOPY WITH PROPOFOL;  Surgeon: Lollie Sails, MD;  Location: [facility] ENDOSCOPY;  Service: Endoscopy;  Laterality: N/A;   COLONOSCOPY WITH PROPOFOL N/A 09/25/2016   Procedure: COLONOSCOPY WITH PROPOFOL;  Surgeon: Manya Silvas, MD;  Location: [facility] ENDOSCOPY;  Service: Endoscopy;  Laterality: N/A;   DILATION AND CURETTAGE OF UTERUS     ESOPHAGOGASTRODUODENOSCOPY (EGD) WITH PROPOFOL  N/A 03/30/2016   Procedure: ESOPHAGOGASTRODUODENOSCOPY (EGD) WITH PROPOFOL;  Surgeon: Lollie Sails, MD;  Location: [facility] ENDOSCOPY;  Service: Endoscopy;  Laterality: N/A;   ESOPHAGOGASTRODUODENOSCOPY (EGD) WITH PROPOFOL N/A 10/11/2019   Procedure: ESOPHAGOGASTRODUODENOSCOPY (EGD) WITH PROPOFOL;  Surgeon: Robert Bellow, MD;  Location: [facility] ENDOSCOPY;  Service: Endoscopy;  Laterality: N/A;   FECAL TRANSPLANT N/A 09/25/2016   Procedure: FECAL TRANSPLANT;  Surgeon: Manya Silvas, MD;  Location: [facility] ENDOSCOPY;  Service: Endoscopy;  Laterality: N/A;   TONSILLECTOMY     TUBAL LIGATION      Social History   Socioeconomic History   Marital status: Divorced    Spouse name: Not on file   Number of children: Not on file   Years of education: Not on file   Highest education level: Not on file  Occupational History   Not on file  Tobacco Use   Smoking status: Never   Smokeless tobacco: Never  Vaping Use   Vaping Use: Never used  Substance and Sexual Activity   Alcohol use: No   Drug use: No   Sexual activity: Yes  Other Topics Concern   Not on file  Social History Narrative   Not on file   Social Determinants of Health   Financial Resource Strain: Not on file  Food Insecurity: Not on file  Transportation Needs: Not on file  Physical Activity: Not on file  Stress: Not on file  Social Connections: Not on file  Intimate Partner Violence: Not on file    Family History  Problem Relation Age of Onset   Emphysema Mother    Lung cancer Father    Emphysema Father    Atrial fibrillation Father    Atrial fibrillation Brother      Current Outpatient Medications:    albuterol (PROVENTIL HFA;VENTOLIN HFA) 108 (90 Base) MCG/ACT inhaler, Inhale 2 puffs into the lungs every 6 (six) hours as needed for wheezing or shortness of breath., Disp: , Rfl:    ALPRAZolam (XANAX) 0.5 MG tablet, Take 0.5 mg by mouth 2 (two) times daily as needed for anxiety (Avoid daily use). , Disp: , Rfl:     benzonatate (TESSALON) 100 MG capsule, Take 100 mg by mouth 3 (three) times daily as needed for cough., Disp: , Rfl:    cyclobenzaprine (FLEXERIL) 5 MG tablet, Take 5 mg by mouth 3 (three) times daily as needed for muscle spasms., Disp: , Rfl:    fexofenadine (ALLEGRA) 180 MG tablet, Take 180 mg by mouth daily., Disp: , Rfl:    FLUoxetine (PROZAC) 40 MG capsule, TAKE 1 CAPSULE BY MOUTH EVERY DAY, Disp: , Rfl:    HYDROcodone-acetaminophen (NORCO) 7.5-325 MG tablet, 1 tablet every 6 (six) hours as needed (Do not use every day). , Disp: , Rfl:    montelukast (SINGULAIR) 10 MG tablet, Take 10 mg by mouth at bedtime., Disp: , Rfl:    omeprazole (PRILOSEC) 20 MG capsule, Take 20 mg by mouth daily., Disp: , Rfl:    ondansetron (ZOFRAN-ODT) 4 MG disintegrating tablet, Take 4 mg by mouth every 8 (eight) hours as needed for nausea or vomiting., Disp: ,  Rfl:    rizatriptan (MAXALT) 10 MG tablet, Take 10 mg by mouth as needed for migraine. May repeat in 2 hours if needed, Disp: , Rfl:    simvastatin (ZOCOR) 20 MG tablet, Take 20 mg by mouth daily., Disp: , Rfl:    traZODone (DESYREL) 100 MG tablet, Take 100 mg by mouth at bedtime., Disp: , Rfl:    Cholecalciferol 1000 units capsule, Take 1,000 Units by mouth daily., Disp: , Rfl:    dicyclomine (BENTYL) 10 MG capsule, Take 10 mg by mouth 4 (four) times daily -  before meals and at bedtime., Disp: , Rfl:    FLUoxetine (PROZAC) 20 MG capsule, Take 40 mg by mouth daily. , Disp: , Rfl:    Fluticasone Furoate (ARNUITY ELLIPTA) 100 MCG/ACT AEPB, Inhale into the lungs., Disp: , Rfl:    fluticasone furoate-vilanterol (BREO ELLIPTA) 100-25 MCG/INH AEPB, Inhale 1 puff into the lungs daily., Disp: , Rfl:    Multiple Vitamin (MULTIVITAMIN) tablet, Take 1 tablet by mouth daily., Disp: , Rfl:    Omega-3 Fatty Acids (FISH OIL) 1000 MG CAPS, Take by mouth., Disp: , Rfl:    sucralfate (CARAFATE) 1 g tablet, Take 1 g by mouth 2 (two) times daily. , Disp: , Rfl:   No  results found.  No images are attached to the encounter.   No flowsheet data found. CBC Latest Ref Rng & Units 07/15/2021  WBC 4.0 - 10.5 K/uL 11.1(H)  Hemoglobin 12.0 - 15.0 g/dL 13.9  Hematocrit 36.0 - 46.0 % 42.1  Platelets 150 - 400 K/uL 120(L)     Observation/objective: Appears in no acute distress over video visit today.  Breathing is nonlabored  Assessment and plan: Patient is a 70 year old female with history of chronic mild leukocytosis mainly neutrophilia/lymphocytosis now referred for thrombocytopenia  Patient did not have thrombocytopenia up until last year now noted to have a platelet count of 100.  I will be repeating her CBC with differential today along with B12 folate HIV and hepatitis C antibody as well as a smear review.  If these lab tests are negative I am inclined to monitor her platelet count conservatively without the need for a bone marrow biopsy.  However if there is a consistent downward trend in her platelet count I will consider getting a bone marrow biopsy.  Overall thrombocytopenia is mild and patient does not have any bleeding or bruising.  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  Follow-up instructions: I will see her in 2 weeks for a video visit  I discussed the assessment and treatment plan with the patient. The patient was provided an opportunity to ask questions and all were answered. The patient agreed with the plan and demonstrated an understanding of the instructions.   The patient was advised to call back or seek an in-person evaluation if the symptoms worsen or if the condition fails to improve as anticipated.   Visit Diagnosis: 1. Thrombocytopenia ([place])     Dr. Randa Evens, MD, MPH [facility] Tel- [phone] 07/17/2021 3:20 PM

## 2021-07-17 NOTE — Progress Notes (Signed)
   Follow-Up Visit   Subjective  Carly Hoffman is a 70 y.o. female who presents for the following: Annual Exam (Patient has noticed a crusted lesion on her L upper arm post that she would like checked today.). Fhx of skin CA in father who was a Horticulturist, commercial.  The following portions of the chart were reviewed this encounter and updated as appropriate:   Tobacco  Allergies  Meds  Problems  Med Hx  Surg Hx  Fam Hx      Review of Systems:  No other skin or systemic complaints except as noted in HPI or Assessment and Plan.  Objective  Well appearing patient in no apparent distress; mood and affect are within normal limits.  A full examination was performed including scalp, head, eyes, ears, nose, lips, neck, chest, axillae, abdomen, back, buttocks, bilateral upper extremities, bilateral lower extremities, hands, feet, fingers, toes, fingernails, and toenails. All findings within normal limits unless otherwise noted below.  L upper arm 0.6 cm erythematous papule.      R foot x 2, L foot x 3 (5) Verrucous papules -- Discussed viral etiology and contagion.   L lower eyelid Erythematous small papule at eyelid margin   Assessment & Plan  Neoplasm of uncertain behavior of skin L upper arm  Skin / nail biopsy Type of biopsy: tangential   Informed consent: discussed and consent obtained   Timeout: patient name, date of birth, surgical site, and procedure verified   Procedure prep:  Patient was prepped and draped in usual sterile fashion Prep type:  Isopropyl alcohol Anesthesia: the lesion was anesthetized in a standard fashion   Anesthetic:  1% lidocaine w/ epinephrine 1-100,000 buffered w/ 8.4% NaHCO3 Instrument used: flexible razor blade   Hemostasis achieved with: pressure, aluminum chloride and electrodesiccation   Outcome: patient tolerated procedure well   Post-procedure details: sterile dressing applied and wound care instructions given   Dressing type: bandage and  petrolatum    Specimen 1 - Surgical pathology Differential Diagnosis: D48.5 r/o ISK vs other  Check Margins: No  Plantar wart (5) R foot x 2, L foot x 3  Discussed viral etiology and risk of spread.    Prescribed 5-fluorouracil/salicylic acid previously but patient has experienced tenderness after using it so she hasn't used it as frequently. Recommend applying with a toothpick as to not apply to non-affected skin.   Hordeolum externum of left lower eyelid L lower eyelid  Recommend warm compress 3 times daily until resolved and Ocusoft eyelid scrub or similar eyelid scrub daily.  If not resolving, see ophthalmology  Return in about 1 year (around 07/17/2022) for TBSE.  Luther Redo, CMA, am acting as scribe for Forest Gleason, MD .  Documentation: I have reviewed the above documentation for accuracy and completeness, and I agree with the above.  Forest Gleason, MD

## 2021-07-17 NOTE — Patient Instructions (Addendum)
For [place] Start Ocusoft eyelid scrub or similar eyelid scrub daily Start warm compresses 3 times per day   If you have any questions or concerns for your doctor, please call our main line at [phone] and press option 4 to reach your doctor's medical assistant. If no one answers, please leave a voicemail as directed and we will return your call as soon as possible. Messages left after 4 pm will be answered the following business day.   You may also send [place] a message via [place]. We typically respond to MyChart messages within 1-2 business days.  For prescription refills, please ask your pharmacy to contact our office. Our fax number is [phone].  If you have an urgent issue when the clinic is closed that cannot wait until the next business day, you can page your doctor at the number below.    Please note that while we do our best to be available for urgent issues outside of office hours, we are not available 24/7.   If you have an urgent issue and are unable to reach [place], you may choose to seek medical care at your doctor's office, retail clinic, urgent care center, or emergency room.  If you have a medical emergency, please immediately call 911 or go to the emergency department.  Pager Numbers  - Dr. Nehemiah Massed: [phone]  - Dr. Laurence Ferrari: [phone]  - Dr. Nicole Kindred: [phone]  In the event of inclement weather, please call our main line at [phone] for an update on the status of any delays or closures.  Dermatology Medication Tips: Please keep the boxes that topical medications come in in order to help keep track of the instructions about where and how to use these. Pharmacies typically print the medication instructions only on the boxes and not directly on the medication tubes.   If your medication is too expensive, please contact our office at [phone] option 4 or send [place] a message through [place].   We are unable to tell what your co-pay for medications will be in  advance as this is different depending on your insurance coverage. However, we may be able to find a substitute medication at lower cost or fill out paperwork to get insurance to cover a needed medication.   If a prior authorization is required to get your medication covered by your insurance company, please allow [place] 1-2 business days to complete this process.  Drug prices often vary depending on where the prescription is filled and some pharmacies may offer cheaper prices.  The website www.goodrx.com contains coupons for medications through different pharmacies. The prices here do not account for what the cost may be with help from insurance (it may be cheaper with your insurance), but the website can give you the price if you did not use any insurance.  - You can print the associated coupon and take it with your prescription to the pharmacy.  - You may also stop by our office during regular business hours and pick up a GoodRx coupon card.  - If you need your prescription sent electronically to a different pharmacy, notify our office through [facility] or by phone at [phone] option 4.

## 2021-07-17 NOTE — Progress Notes (Deleted)
Follow-Up Visit   Subjective  Carly Hoffman is a 70 y.o. female who presents for the following: Annual Exam (Patient has noticed a crusted lesion on her L upper arm post that she would like checked today.).  The following portions of the chart were reviewed this encounter and updated as appropriate:      Review of Systems:  No other skin or systemic complaints except as noted in HPI or Assessment and Plan.  Objective  Well appearing patient in no apparent distress; mood and affect are within normal limits.  A full examination was performed including scalp, head, eyes, ears, nose, lips, neck, chest, axillae, abdomen, back, buttocks, bilateral upper extremities, bilateral lower extremities, hands, feet, fingers, toes, fingernails, and toenails. All findings within normal limits unless otherwise noted below.   Assessment & Plan  Neoplasm of uncertain behavior of skin L upper arm  Skin / nail biopsy Type of biopsy: tangential   Informed consent: discussed and consent obtained   Timeout: patient name, date of birth, surgical site, and procedure verified   Procedure prep:  Patient was prepped and draped in usual sterile fashion Prep type:  Isopropyl alcohol Anesthesia: the lesion was anesthetized in a standard fashion   Anesthetic:  1% lidocaine w/ epinephrine 1-100,000 buffered w/ 8.4% NaHCO3 Instrument used: flexible razor blade   Hemostasis achieved with: pressure, aluminum chloride and electrodesiccation   Outcome: patient tolerated procedure well   Post-procedure details: sterile dressing applied and wound care instructions given   Dressing type: bandage and petrolatum    Specimen 1 - Surgical pathology Differential Diagnosis: D48.5 r/o ISK vs other  Check Margins: No  Plantar wart (5) R foot x 2, L foot x 3  Discussed viral etiology and risk of spread.  Discussed multiple treatments may be required to clear warts.  Discussed possible post-treatment dyspigmentation and  risk of recurrence.  Prescribed 5-fluorouracil/salicylic acid previously but patient has experienced tenderness after using it so she hasn't used it as frequently. Recommend applying with a toothpick as to not apply to non-affected skin.   Hordeolum externum of left lower eyelid L lower eyelid  Recommend warm compress daily until resolved and Ocusoft eyelid scrub or similar eyelid scrub daily.   Lentigines - Scattered tan macules - Due to sun exposure - Benign-appering, observe - Recommend daily broad spectrum sunscreen SPF 30+ to sun-exposed areas, reapply every 2 hours as needed. - Call for any changes  Seborrheic Keratoses - Stuck-on, waxy, tan-brown papules and/or plaques  - Benign-appearing - Discussed benign etiology and prognosis. - Observe - Call for any changes  Melanocytic Nevi - Tan-brown and/or pink-flesh-colored symmetric macules and papules - Benign appearing on exam today - Observation - Call clinic for new or changing moles - Recommend daily use of broad spectrum spf 30+ sunscreen to sun-exposed areas.   Hemangiomas - Red papules - Discussed benign nature - Observe - Call for any changes  Actinic Damage - Chronic condition, secondary to cumulative UV/sun exposure - diffuse scaly erythematous macules with underlying dyspigmentation - Recommend daily broad spectrum sunscreen SPF 30+ to sun-exposed areas, reapply every 2 hours as needed.  - Staying in the shade or wearing long sleeves, sun glasses (UVA+UVB protection) and wide brim hats (4-inch brim around the entire circumference of the hat) are also recommended for sun protection.  - Call for new or changing lesions.  Skin cancer screening performed today.  Return in about 1 year (around 07/17/2022) for TBSE.  Luther Redo, CMA,  am acting as scribe for Forest Gleason, MD .

## 2021-07-20 ENCOUNTER — Encounter: Payer: Self-pay | Admitting: Dermatology

## 2021-07-31 ENCOUNTER — Encounter: Payer: Self-pay | Admitting: *Deleted

## 2021-08-01 ENCOUNTER — Other Ambulatory Visit: Payer: Self-pay

## 2021-08-01 ENCOUNTER — Ambulatory Visit
Admission: RE | Admit: 2021-08-01 | Discharge: 2021-08-01 | Disposition: A | Discharge status: Home / Self Care | Payer: PPO | Attending: Gastroenterology | Admitting: Gastroenterology

## 2021-08-01 ENCOUNTER — Ambulatory Visit: Payer: PPO | Admitting: Anesthesiology

## 2021-08-01 ENCOUNTER — Encounter
Admission: RE | Disposition: A | Discharge status: Home / Self Care | Payer: Self-pay | Source: Home / Self Care | Attending: Gastroenterology

## 2021-08-01 ENCOUNTER — Encounter: Payer: Self-pay | Admitting: *Deleted

## 2021-08-01 DIAGNOSIS — Z86010 Personal history of colonic polyps: Secondary | ICD-10-CM | POA: Diagnosis not present

## 2021-08-01 DIAGNOSIS — Z888 Allergy status to other drugs, medicaments and biological substances status: Secondary | ICD-10-CM | POA: Insufficient documentation

## 2021-08-01 DIAGNOSIS — Z1211 Encounter for screening for malignant neoplasm of colon: Secondary | ICD-10-CM | POA: Insufficient documentation

## 2021-08-01 DIAGNOSIS — R1310 Dysphagia, unspecified: Secondary | ICD-10-CM | POA: Diagnosis not present

## 2021-08-01 DIAGNOSIS — Z79899 Other long term (current) drug therapy: Secondary | ICD-10-CM | POA: Diagnosis not present

## 2021-08-01 DIAGNOSIS — Z880 Allergy status to penicillin: Secondary | ICD-10-CM | POA: Diagnosis not present

## 2021-08-01 DIAGNOSIS — D122 Benign neoplasm of ascending colon: Secondary | ICD-10-CM | POA: Diagnosis not present

## 2021-08-01 DIAGNOSIS — K2970 Gastritis, unspecified, without bleeding: Secondary | ICD-10-CM | POA: Diagnosis not present

## 2021-08-01 DIAGNOSIS — Z7951 Long term (current) use of inhaled steroids: Secondary | ICD-10-CM | POA: Insufficient documentation

## 2021-08-01 DIAGNOSIS — K2950 Unspecified chronic gastritis without bleeding: Secondary | ICD-10-CM | POA: Insufficient documentation

## 2021-08-01 DIAGNOSIS — E785 Hyperlipidemia, unspecified: Secondary | ICD-10-CM | POA: Diagnosis not present

## 2021-08-01 DIAGNOSIS — K449 Diaphragmatic hernia without obstruction or gangrene: Secondary | ICD-10-CM | POA: Diagnosis not present

## 2021-08-01 DIAGNOSIS — D123 Benign neoplasm of transverse colon: Secondary | ICD-10-CM | POA: Diagnosis not present

## 2021-08-01 DIAGNOSIS — K635 Polyp of colon: Secondary | ICD-10-CM | POA: Diagnosis not present

## 2021-08-01 DIAGNOSIS — K31A11 Gastric intestinal metaplasia without dysplasia, involving the antrum: Secondary | ICD-10-CM | POA: Diagnosis not present

## 2021-08-01 DIAGNOSIS — K3189 Other diseases of stomach and duodenum: Secondary | ICD-10-CM | POA: Diagnosis not present

## 2021-08-01 HISTORY — PX: COLONOSCOPY WITH PROPOFOL: SHX5780

## 2021-08-01 HISTORY — PX: ESOPHAGOGASTRODUODENOSCOPY (EGD) WITH PROPOFOL: SHX5813

## 2021-08-01 HISTORY — DX: Personal history of other diseases of the respiratory system: Z87.09

## 2021-08-01 SURGERY — ESOPHAGOGASTRODUODENOSCOPY (EGD) WITH PROPOFOL
Anesthesia: General

## 2021-08-01 MED ORDER — PROPOFOL 10 MG/ML IV BOLUS
INTRAVENOUS | Status: DC | PRN
  Administered 2021-08-01: 50 mg via INTRAVENOUS
  Administered 2021-08-01: 20 mg via INTRAVENOUS

## 2021-08-01 MED ORDER — SODIUM CHLORIDE 0.9 % IV SOLN: INTRAVENOUS | Status: DC

## 2021-08-01 MED ORDER — LIDOCAINE HCL (CARDIAC) PF 100 MG/5ML IV SOSY
PREFILLED_SYRINGE | INTRAVENOUS | Status: DC | PRN
  Administered 2021-08-01: 100 mg via INTRAVENOUS

## 2021-08-01 MED ORDER — PROPOFOL 500 MG/50ML IV EMUL
INTRAVENOUS | Status: DC | PRN
  Administered 2021-08-01: 125 ug/kg/min via INTRAVENOUS

## 2021-08-01 NOTE — Interval H&P Note (Signed)
History and Physical Interval Note:  08/01/2021 1:31 PM  Carly Hoffman  has presented today for surgery, with the diagnosis of PERSONAL HX. OF COLON POLYP,GERD.  The various methods of treatment have been discussed with the patient and family. After consideration of risks, benefits and other options for treatment, the patient has consented to  Procedure(s): ESOPHAGOGASTRODUODENOSCOPY (EGD) WITH PROPOFOL (N/A) COLONOSCOPY WITH PROPOFOL (N/A) as a surgical intervention.  The patient's history has been reviewed, patient examined, no change in status, stable for surgery.  I have reviewed the patient's chart and labs.  Questions were answered to the patient's satisfaction.     Lesly Rubenstein  Ok to proceed with EGD/Colonoscopy

## 2021-08-01 NOTE — Op Note (Signed)
[facility]ology Patient Name: Carly Hoffman Procedure Date: 08/01/2021 1:24 PM MRN: [id number] Account #: [id number] Date of Birth: 1951/09/17 Admit Type: Outpatient Age: 70 Room: [facility] ENDO ROOM 3 Gender: Female Note Status: Finalized Instrument Name: Altamese Cabal Endoscope [id number] Procedure:             Upper GI endoscopy Indications:           Dysphagia Providers:             Andrey Farmer MD, MD Referring MD:          Youlanda Roys. Lovie Macadamia, MD (Referring MD) Medicines:             Monitored Anesthesia Care Complications:         No immediate complications. Estimated blood loss:                         Minimal. Procedure:             Pre-Anesthesia Assessment:                        - Prior to the procedure, a History and Physical was                         performed, and patient medications and allergies were                         reviewed. The patient is competent. The risks and                         benefits of the procedure and the sedation options and                         risks were discussed with the patient. All questions                         were answered and informed consent was obtained.                         Patient identification and proposed procedure were                         verified by the physician, the nurse, the anesthetist                         and the technician in the endoscopy suite. Mental                         Status Examination: alert and oriented. Airway                         Examination: normal oropharyngeal airway and neck                         mobility. Respiratory Examination: clear to                         auscultation. CV Examination: normal. Prophylactic  Antibiotics: The patient does not require prophylactic                         antibiotics. Prior Anticoagulants: The patient has                         taken no previous anticoagulant or antiplatelet                          agents. ASA Grade Assessment: II - A patient with mild                         systemic disease. After reviewing the risks and                         benefits, the patient was deemed in satisfactory                         condition to undergo the procedure. The anesthesia                         plan was to use monitored anesthesia care (MAC).                         Immediately prior to administration of medications,                         the patient was re-assessed for adequacy to receive                         sedatives. The heart rate, respiratory rate, oxygen                         saturations, blood pressure, adequacy of pulmonary                         ventilation, and response to care were monitored                         throughout the procedure. The physical status of the                         patient was re-assessed after the procedure.                        After obtaining informed consent, the endoscope was                         passed under direct vision. Throughout the procedure,                         the patient's blood pressure, pulse, and oxygen                         saturations were monitored continuously. The Endoscope                         was introduced through the mouth, and advanced to the  second part of duodenum. The upper GI endoscopy was                         accomplished without difficulty. The patient tolerated                         the procedure well. Findings:      A small hiatal hernia was present.      The examined esophagus was normal.      There is no endoscopic evidence of Barrett's esophagus in the lower       third of the esophagus.      Patchy moderate inflammation characterized by erythema was found in the       gastric antrum. Biopsies were taken with a cold forceps for histology.       Estimated blood loss was minimal.      The examined duodenum was normal. Impression:            - Small hiatal  hernia.                        - Normal esophagus.                        - Gastritis. Biopsied.                        - Normal examined duodenum. Recommendation:        - Perform a colonoscopy today. Procedure Code(s):     --- Professional ---                        [phone], Esophagogastroduodenoscopy, flexible,                         transoral; with biopsy, single or multiple Diagnosis Code(s):     --- Professional ---                        K44.9, Diaphragmatic hernia without obstruction or                         gangrene                        K29.70, Gastritis, unspecified, without bleeding                        R13.10, Dysphagia, unspecified CPT copyright 2019 American Medical Association. All rights reserved. The codes documented in this report are preliminary and upon coder review may  be revised to meet current compliance requirements. Andrey Farmer MD, MD 08/01/2021 2:22:34 PM Number of Addenda: 0 Note Initiated On: 08/01/2021 1:24 PM Estimated Blood Loss:  Estimated blood loss was minimal.      [facility]

## 2021-08-01 NOTE — Transfer of Care (Signed)
Immediate Anesthesia Transfer of Care Note  Patient: ANDI ORDERS  Procedure(s) Performed: ESOPHAGOGASTRODUODENOSCOPY (EGD) WITH PROPOFOL COLONOSCOPY WITH PROPOFOL  Patient Location: PACU and Endoscopy Unit  Anesthesia Type:General  Level of Consciousness: patient cooperative  Airway & Oxygen Therapy: Patient Spontanous Breathing and Patient connected to nasal cannula oxygen  Post-op Assessment: Report given to RN and Post -op Vital signs reviewed and stable  Post vital signs: Reviewed and stable  Last Vitals:  Vitals Value Taken Time  BP    Temp    Pulse    Resp    SpO2      Last Pain:  Vitals:   08/01/21 1246  TempSrc: Temporal  PainSc: 7          Complications: No notable events documented.

## 2021-08-01 NOTE — H&P (Signed)
Outpatient short stay form Pre-procedure 08/01/2021  Carly Rubenstein, MD  Primary Physician: Juluis Pitch, MD  Reason for visit:  Dysphagia/Surveillance  History of present illness:   70 y/o lady with history of dysphagia to solids and history of colon polyps here for EGD/Colonoscopy. Last colonoscopy in 2017 with adenomatous polyps. No blood thinners. History of appendectomy. No family history of GI malignancies.    Current Facility-Administered Medications:    0.9 %  sodium chloride infusion, , Intravenous, Continuous, Destaney Sarkis, Hilton Cork, MD, Last Rate: 20 mL/hr at 08/01/21 1306, New Bag at 08/01/21 1306  Medications Prior to Admission  Medication Sig Dispense Refill Last Dose   albuterol (PROVENTIL HFA;VENTOLIN HFA) 108 (90 Base) MCG/ACT inhaler Inhale 2 puffs into the lungs every 6 (six) hours as needed for wheezing or shortness of breath.   07/31/2021   ALPRAZolam (XANAX) 0.5 MG tablet Take 0.5 mg by mouth 2 (two) times daily as needed for anxiety (Avoid daily use).    Past Week   Cholecalciferol 1000 units capsule Take 1,000 Units by mouth daily.   Past Week   cyclobenzaprine (FLEXERIL) 5 MG tablet Take 5 mg by mouth 3 (three) times daily as needed for muscle spasms.   Past Week   fexofenadine (ALLEGRA) 180 MG tablet Take 180 mg by mouth daily.   07/31/2021   FLUoxetine (PROZAC) 20 MG capsule Take 40 mg by mouth daily.    07/31/2021   FLUoxetine (PROZAC) 40 MG capsule TAKE 1 CAPSULE BY MOUTH EVERY DAY   07/31/2021   Fluticasone Furoate 100 MCG/ACT AEPB Inhale into the lungs.   07/31/2021   HYDROcodone-acetaminophen (NORCO) 7.5-325 MG tablet 1 tablet every 6 (six) hours as needed (Do not use every day).    Past Week   montelukast (SINGULAIR) 10 MG tablet Take 10 mg by mouth at bedtime.   07/31/2021   Multiple Vitamin (MULTIVITAMIN) tablet Take 1 tablet by mouth daily.   07/31/2021   omeprazole (PRILOSEC) 20 MG capsule Take 20 mg by mouth daily.   07/31/2021   ondansetron (ZOFRAN-ODT) 4 MG  disintegrating tablet Take 4 mg by mouth every 8 (eight) hours as needed for nausea or vomiting.   07/31/2021   rizatriptan (MAXALT) 10 MG tablet Take 10 mg by mouth as needed for migraine. May repeat in 2 hours if needed   Past Week   simvastatin (ZOCOR) 20 MG tablet Take 20 mg by mouth daily.   07/31/2021   traZODone (DESYREL) 100 MG tablet Take 100 mg by mouth at bedtime.   07/31/2021   benzonatate (TESSALON) 100 MG capsule Take 100 mg by mouth 3 (three) times daily as needed for cough.      budesonide-formoterol (SYMBICORT) 160-4.5 MCG/ACT inhaler Inhale 2 puffs into the lungs 2 (two) times daily. (Patient not taking: Reported on 08/01/2021)   Not Taking   dicyclomine (BENTYL) 10 MG capsule Take 10 mg by mouth 4 (four) times daily -  before meals and at bedtime. (Patient not taking: Reported on 08/01/2021)   Not Taking   fluticasone furoate-vilanterol (BREO ELLIPTA) 100-25 MCG/INH AEPB Inhale 1 puff into the lungs daily. (Patient not taking: Reported on 08/01/2021)   Not Taking   Omega-3 Fatty Acids (FISH OIL) 1000 MG CAPS Take by mouth. (Patient not taking: Reported on 08/01/2021)   Not Taking   sucralfate (CARAFATE) 1 g tablet Take 1 g by mouth 2 (two) times daily.  (Patient not taking: Reported on 08/01/2021)   Not Taking  Allergies  Allergen Reactions   Penicillins     G   Topiramate Nausea Only    Shaky, imbalnced   Penicillin G Rash     Past Medical History:  Diagnosis Date   Asthma    Barrett esophagus    Barrett's esophagus    C. difficile diarrhea 03/2016   hospitalized in [place] x 4 d w c diff after colonoscopy    COPD (chronic obstructive pulmonary disease) (HCC)    Depression    Diarrhea    Diverticulosis    GERD (gastroesophageal reflux disease)    H/O seasonal allergies    H/O sinusitis    unspecified   Headache    migranes   History of hiatal hernia    Hyperlipidemia    Osteoporosis, postmenopausal    Shortness of breath dyspnea    Thrombocytopenia ([place]) 07/15/2021    Possible   Varicose vein     Review of systems:  Otherwise negative.    Physical Exam  Gen: Alert, oriented. Appears stated age.  HEENT: PERRLA. Lungs: No respiratory distress CV: RRR Abd: soft, benign, no masses Ext: No edema    Planned procedures: Proceed with EGD/colonoscopy. The patient understands the nature of the planned procedure, indications, risks, alternatives and potential complications including but not limited to bleeding, infection, perforation, damage to internal organs and possible oversedation/side effects from anesthesia. The patient agrees and gives consent to proceed.  Please refer to procedure notes for findings, recommendations and patient disposition/instructions.     Carly Rubenstein, MD [facility] Gastroenterology

## 2021-08-01 NOTE — Op Note (Signed)
[facility] Gastroenterology Patient Name: Carly Hoffman Procedure Date: 08/01/2021 1:22 PM MRN: [id number] Account #: [id number] Date of Birth: October 29, 1951 Admit Type: Outpatient Age: 70 Room: [facility] ENDO ROOM 3 Gender: Female Note Status: Finalized Instrument Name: Park Meo [id number] Procedure:             Colonoscopy Indications:           Surveillance: Personal history of adenomatous polyps                         on last colonoscopy 5 years ago Providers:             Andrey Farmer MD, MD Referring MD:          Youlanda Roys. Lovie Macadamia, MD (Referring MD) Medicines:             Monitored Anesthesia Care Complications:         No immediate complications. Estimated blood loss:                         Minimal. Procedure:             Pre-Anesthesia Assessment:                        - Prior to the procedure, a History and Physical was                         performed, and patient medications and allergies were                         reviewed. The patient is competent. The risks and                         benefits of the procedure and the sedation options and                         risks were discussed with the patient. All questions                         were answered and informed consent was obtained.                         Patient identification and proposed procedure were                         verified by the physician, the nurse, the anesthetist                         and the technician in the endoscopy suite. Mental                         Status Examination: alert and oriented. Airway                         Examination: normal oropharyngeal airway and neck                         mobility. Respiratory Examination: clear to  auscultation. CV Examination: normal. Prophylactic                         Antibiotics: The patient does not require prophylactic                         antibiotics. Prior Anticoagulants: The patient has                          taken no previous anticoagulant or antiplatelet                         agents. ASA Grade Assessment: II - A patient with mild                         systemic disease. After reviewing the risks and                         benefits, the patient was deemed in satisfactory                         condition to undergo the procedure. The anesthesia                         plan was to use monitored anesthesia care (MAC).                         Immediately prior to administration of medications,                         the patient was re-assessed for adequacy to receive                         sedatives. The heart rate, respiratory rate, oxygen                         saturations, blood pressure, adequacy of pulmonary                         ventilation, and response to care were monitored                         throughout the procedure. The physical status of the                         patient was re-assessed after the procedure.                        After obtaining informed consent, the colonoscope was                         passed under direct vision. Throughout the procedure,                         the patient's blood pressure, pulse, and oxygen                         saturations were monitored continuously. The  Colonoscope was introduced through the anus and                         advanced to the the cecum, identified by appendiceal                         orifice and ileocecal valve. The colonoscopy was                         performed without difficulty. The patient tolerated                         the procedure well. The quality of the bowel                         preparation was good. Findings:      The perianal and digital rectal examinations were normal.      A 3 mm polyp was found in the transverse colon. The polyp was sessile.       The polyp was removed with a cold snare. Resection and retrieval were       complete. Estimated  blood loss was minimal.      A 1 mm polyp was found in the transverse colon. The polyp was sessile.       The polyp was removed with a jumbo cold forceps. Resection and retrieval       were complete. Estimated blood loss was minimal.      A 4 mm polyp was found in the ascending colon. The polyp was flat. The       polyp was removed with a cold snare. Resection and retrieval were       complete. Estimated blood loss was minimal.      The exam was otherwise without abnormality on direct and retroflexion       views. Impression:            - One 3 mm polyp in the transverse colon, removed with                         a cold snare. Resected and retrieved.                        - One 1 mm polyp in the transverse colon, removed with                         a jumbo cold forceps. Resected and retrieved.                        - One 4 mm polyp in the ascending colon, removed with                         a cold snare. Resected and retrieved.                        - The examination was otherwise normal on direct and                         retroflexion views. Recommendation:        - Discharge patient to home.                        -  Resume previous diet.                        - Continue present medications.                        - Await pathology results.                        - Repeat colonoscopy for surveillance based on                         pathology results.                        - Return to referring physician as previously                         scheduled. Procedure Code(s):     --- Professional ---                        [phone], Colonoscopy, flexible; with removal of                         tumor(s), polyp(s), or other lesion(s) by snare                         technique                        [id number], 81, Colonoscopy, flexible; with biopsy, single                         or multiple Diagnosis Code(s):     --- Professional ---                        Z86.010, Personal history of  colonic polyps                        K63.5, Polyp of colon CPT copyright 2019 American Medical Association. All rights reserved. The codes documented in this report are preliminary and upon coder review may  be revised to meet current compliance requirements. Andrey Farmer MD, MD 08/01/2021 2:25:37 PM Number of Addenda: 0 Note Initiated On: 08/01/2021 1:22 PM Scope Withdrawal Time: 0 hours 10 minutes 20 seconds  Total Procedure Duration: 0 hours 19 minutes 34 seconds  Estimated Blood Loss:  Estimated blood loss was minimal.      [facility]

## 2021-08-01 NOTE — Anesthesia Preprocedure Evaluation (Signed)
Anesthesia Evaluation  Patient identified by MRN, date of birth, ID band Patient awake    Reviewed: Allergy & Precautions, H&P , NPO status , Patient's Chart, lab work & pertinent test results  Airway Mallampati: II  TM Distance: >3 FB Neck ROM: full    Dental  (+) Teeth Intact   Pulmonary shortness of breath, asthma , COPD,  COPD inhaler,           Cardiovascular negative cardio ROS       Neuro/Psych  Headaches, PSYCHIATRIC DISORDERS Depression    GI/Hepatic Neg liver ROS, hiatal hernia, Bowel prep,GERD  Medicated,  Endo/Other  negative endocrine ROS  Renal/GU negative Renal ROS  negative genitourinary   Musculoskeletal   Abdominal   Peds  Hematology negative hematology ROS (+)   Anesthesia Other Findings Asthma     Barrett's esophagus     COPD    Depression    Diarrhea    Diverticulosis    GERD (gastroesophageal reflux disease) H/O seasonal allergies   Headache  migranes  History of hiatal hernia    Hyperlipidemia    Osteoporosis, postmenopausal  Shortness of breath dyspnea   Thrombocytopenia (HCC) 07/15/2021 Possible  Varicose vein       Reproductive/Obstetrics negative OB ROS                            Anesthesia Physical  Anesthesia Plan  ASA: 3  Anesthesia Plan: General   Post-op Pain Management:    Induction: Intravenous  PONV Risk Score and Plan: Propofol infusion  Airway Management Planned: Natural Airway and Nasal Cannula  Additional Equipment:   Intra-op Plan:   Post-operative Plan:   Informed Consent: I have reviewed the patients History and Physical, chart, labs and discussed the procedure including the risks, benefits and alternatives for the proposed anesthesia with the patient or authorized representative who has indicated his/her understanding and acceptance.     Dental Advisory Given  Plan Discussed with: Anesthesiologist, CRNA and  Surgeon  Anesthesia Plan Comments:        Anesthesia Quick Evaluation

## 2021-08-02 NOTE — Anesthesia Postprocedure Evaluation (Signed)
Anesthesia Post Note  Patient: Carly Hoffman  Procedure(s) Performed: ESOPHAGOGASTRODUODENOSCOPY (EGD) WITH PROPOFOL COLONOSCOPY WITH PROPOFOL  Anesthesia Type: General Anesthetic complications: no   No notable events documented.   Last Vitals:  Vitals:   08/01/21 1431 08/01/21 1441  BP: 131/63 140/74  Pulse: 80 72  Resp: 17 (!) 26  Temp:    SpO2: 98% 99%    Last Pain:  Vitals:   08/01/21 1441  TempSrc:   PainSc: 0-No pain                 Phill Mutter

## 2021-08-04 ENCOUNTER — Encounter: Payer: Self-pay | Admitting: Gastroenterology

## 2021-08-04 ENCOUNTER — Inpatient Hospital Stay: Payer: PPO | Attending: Oncology | Admitting: Oncology

## 2021-08-04 DIAGNOSIS — D696 Thrombocytopenia, unspecified: Secondary | ICD-10-CM | POA: Diagnosis not present

## 2021-08-04 NOTE — Progress Notes (Signed)
I connected with Carly Hoffman on 08/04/21 at  2:30 PM EDT by video enabled telemedicine visit and verified that I am speaking with the correct person using two identifiers.   I discussed the limitations, risks, security and privacy concerns of performing an evaluation and management service by telemedicine and the availability of in-person appointments. I also discussed with the patient that there may be a patient responsible charge related to this service. The patient expressed understanding and agreed to proceed.  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  Flow cytometry and BCR ABL testing did not reveal any abnormality.  She was then asked to follow-up with her PCP.  Since then her white count has remained stably elevated between 11-18 and it waxes and wanes.  Differential is mainly showed neutrophilia and lymphocytosis with no clear rising trend.  On her recent blood work from 07/01/2021 patient noted to have a white count of 13.1, H&H of 14/42.9 and a platelet count of 100.  Prior to that her platelet count was normal at 185 in July 2021.  She has therefore been referred to [place] for thrombocytopenia.  Patient denies any over-the-counter herbal supplements.  No recent changes in her medications.  She denies any bleeding or bruising.  CBC showed platelet count of 120 with a normal white count and hemoglobin.  HIV and hepatitis C testing negative.  B12 and folate were normal.  Interval history patient reports doing well presently and denies any new complaints at this time.   Review of Systems  Constitutional:  Negative for chills, fever, malaise/fatigue and weight loss.  HENT:  Negative for  congestion, ear discharge and nosebleeds.   Eyes:  Negative for blurred vision.  Respiratory:  Negative for cough, hemoptysis, sputum production, shortness of breath and wheezing.   Cardiovascular:  Negative for chest pain, palpitations, orthopnea and claudication.  Gastrointestinal:  Negative for abdominal pain, blood in stool, constipation, diarrhea, heartburn, melena, nausea and vomiting.  Genitourinary:  Negative for dysuria, flank pain, frequency, hematuria and urgency.  Musculoskeletal:  Negative for back pain, joint pain and myalgias.  Skin:  Negative for rash.  Neurological:  Negative for dizziness, tingling, focal weakness, seizures, weakness and headaches.  Endo/Heme/Allergies:  Does not bruise/bleed easily.  Psychiatric/Behavioral:  Negative for depression and suicidal ideas. The patient does not have insomnia.    Allergies  Allergen Reactions   Penicillins     G   Topiramate Nausea Only    Shaky, imbalnced   Penicillin G Rash    Past Medical History:  Diagnosis Date   Asthma    Barrett esophagus    Barrett's esophagus    C. difficile diarrhea 03/2016   hospitalized in [place] x 4 d w c diff after colonoscopy    COPD (chronic obstructive pulmonary disease) (HCC)    Depression    Diarrhea    Diverticulosis    GERD (gastroesophageal reflux disease)    H/O seasonal allergies    H/O sinusitis    unspecified   Headache    migranes   History of hiatal hernia    Hyperlipidemia    Osteoporosis, postmenopausal    Shortness of breath dyspnea    Thrombocytopenia ([place]) 07/15/2021  Possible   Varicose vein     Past Surgical History:  Procedure Laterality Date   APPENDECTOMY     COLONOSCOPY WITH ESOPHAGOGASTRODUODENOSCOPY (EGD)     COLONOSCOPY WITH PROPOFOL N/A 03/30/2016   Procedure: COLONOSCOPY WITH PROPOFOL;  Surgeon: Lollie Sails, MD;  Location: [facility] ENDOSCOPY;  Service: Endoscopy;  Laterality: N/A;   COLONOSCOPY WITH PROPOFOL N/A 09/25/2016   Procedure:  COLONOSCOPY WITH PROPOFOL;  Surgeon: Manya Silvas, MD;  Location: [facility] ENDOSCOPY;  Service: Endoscopy;  Laterality: N/A;   COLONOSCOPY WITH PROPOFOL N/A 08/01/2021   Procedure: COLONOSCOPY WITH PROPOFOL;  Surgeon: Lesly Rubenstein, MD;  Location: [facility] ENDOSCOPY;  Service: Endoscopy;  Laterality: N/A;   DILATION AND CURETTAGE OF UTERUS     ESOPHAGOGASTRODUODENOSCOPY (EGD) WITH PROPOFOL N/A 03/30/2016   Procedure: ESOPHAGOGASTRODUODENOSCOPY (EGD) WITH PROPOFOL;  Surgeon: Lollie Sails, MD;  Location: [facility] ENDOSCOPY;  Service: Endoscopy;  Laterality: N/A;   ESOPHAGOGASTRODUODENOSCOPY (EGD) WITH PROPOFOL N/A 10/11/2019   Procedure: ESOPHAGOGASTRODUODENOSCOPY (EGD) WITH PROPOFOL;  Surgeon: Robert Bellow, MD;  Location: [facility] ENDOSCOPY;  Service: Endoscopy;  Laterality: N/A;   ESOPHAGOGASTRODUODENOSCOPY (EGD) WITH PROPOFOL N/A 08/01/2021   Procedure: ESOPHAGOGASTRODUODENOSCOPY (EGD) WITH PROPOFOL;  Surgeon: Lesly Rubenstein, MD;  Location: [facility] ENDOSCOPY;  Service: Endoscopy;  Laterality: N/A;   FECAL TRANSPLANT N/A 09/25/2016   Procedure: FECAL TRANSPLANT;  Surgeon: Manya Silvas, MD;  Location: [facility] ENDOSCOPY;  Service: Endoscopy;  Laterality: N/A;   TONSILLECTOMY     TUBAL LIGATION      Social History   Socioeconomic History   Marital status: Divorced    Spouse name: Not on file   Number of children: Not on file   Years of education: Not on file   Highest education level: Not on file  Occupational History   Not on file  Tobacco Use   Smoking status: Never   Smokeless tobacco: Never  Vaping Use   Vaping Use: Never used  Substance and Sexual Activity   Alcohol use: No   Drug use: No   Sexual activity: Yes  Other Topics Concern   Not on file  Social History Narrative   Not on file   Social Determinants of Health   Financial Resource Strain: Not on file  Food Insecurity: Not on file  Transportation Needs: Not on file  Physical Activity: Not on file  Stress: Not on  file  Social Connections: Not on file  Intimate Partner Violence: Not on file    Family History  Problem Relation Age of Onset   Emphysema Mother    Lung cancer Father    Emphysema Father    Atrial fibrillation Father    Atrial fibrillation Brother      Current Outpatient Medications:    albuterol (PROVENTIL HFA;VENTOLIN HFA) 108 (90 Base) MCG/ACT inhaler, Inhale 2 puffs into the lungs every 6 (six) hours as needed for wheezing or shortness of breath., Disp: , Rfl:    ALPRAZolam (XANAX) 0.5 MG tablet, Take 0.5 mg by mouth 2 (two) times daily as needed for anxiety (Avoid daily use). , Disp: , Rfl:    budesonide-formoterol (SYMBICORT) 160-4.5 MCG/ACT inhaler, Inhale 2 puffs into the lungs 2 (two) times daily., Disp: , Rfl:    cyclobenzaprine (FLEXERIL) 5 MG tablet, Take 5 mg by mouth 3 (three) times daily as needed for muscle spasms., Disp: , Rfl:    fexofenadine (ALLEGRA) 180 MG tablet, Take 180 mg by mouth daily., Disp: , Rfl:    FLUoxetine (PROZAC) 40 MG capsule,  TAKE 1 CAPSULE BY MOUTH EVERY DAY, Disp: , Rfl:    HYDROcodone-acetaminophen (NORCO) 7.5-325 MG tablet, 1 tablet every 6 (six) hours as needed (Do not use every day). , Disp: , Rfl:    montelukast (SINGULAIR) 10 MG tablet, Take 10 mg by mouth at bedtime., Disp: , Rfl:    Multiple Vitamin (MULTIVITAMIN) tablet, Take 1 tablet by mouth daily., Disp: , Rfl:    omeprazole (PRILOSEC) 20 MG capsule, Take 20 mg by mouth daily., Disp: , Rfl:    rizatriptan (MAXALT) 10 MG tablet, Take 10 mg by mouth as needed for migraine. May repeat in 2 hours if needed, Disp: , Rfl:    simvastatin (ZOCOR) 20 MG tablet, Take 20 mg by mouth daily., Disp: , Rfl:    traZODone (DESYREL) 100 MG tablet, Take 100 mg by mouth at bedtime., Disp: , Rfl:    benzonatate (TESSALON) 100 MG capsule, Take 100 mg by mouth 3 (three) times daily as needed for cough., Disp: , Rfl:    Cholecalciferol 1000 units capsule, Take 1,000 Units by mouth daily., Disp: , Rfl:     dicyclomine (BENTYL) 10 MG capsule, Take 10 mg by mouth 4 (four) times daily -  before meals and at bedtime. (Patient not taking: Reported on 08/01/2021), Disp: , Rfl:    FLUoxetine (PROZAC) 20 MG capsule, Take 40 mg by mouth daily. , Disp: , Rfl:    Fluticasone Furoate 100 MCG/ACT AEPB, Inhale into the lungs., Disp: , Rfl:    fluticasone furoate-vilanterol (BREO ELLIPTA) 100-25 MCG/INH AEPB, Inhale 1 puff into the lungs daily. (Patient not taking: Reported on 08/01/2021), Disp: , Rfl:    Omega-3 Fatty Acids (FISH OIL) 1000 MG CAPS, Take by mouth. (Patient not taking: Reported on 08/01/2021), Disp: , Rfl:    ondansetron (ZOFRAN-ODT) 4 MG disintegrating tablet, Take 4 mg by mouth every 8 (eight) hours as needed for nausea or vomiting. (Patient not taking: Reported on 08/04/2021), Disp: , Rfl:    sucralfate (CARAFATE) 1 g tablet, Take 1 g by mouth 2 (two) times daily.  (Patient not taking: Reported on 08/01/2021), Disp: , Rfl:   No results found.  No images are attached to the encounter.   No flowsheet data found. CBC Latest Ref Rng & Units 07/15/2021  WBC 4.0 - 10.5 K/uL 11.1(H)  Hemoglobin 12.0 - 15.0 g/dL 13.9  Hematocrit 36.0 - 46.0 % 42.1  Platelets 150 - 400 K/uL 120(L)     Observation/objective: Appears in no acute distress over video visit today.  Breathing is nonlabored  Assessment and plan: Patient is a 70 year old female referred for isolated thrombocytopenia  Patient has mild isolated thrombocytopenia in the absence of other cytopenias.  Hemoglobin is normal.  Patient has always had a mild leukocytosis and has been worked up for that in the past as well.  Given that her thrombocytopenia is mild she does not require a bone marrow biopsy at this time.  HIV hepatitis C B12 and folate work-up was unremarkable.  Follow-up instructions: CBC with differential in 3 in 6 months and I will see her back in 6 months  I discussed the assessment and treatment plan with the patient. The patient was  provided an opportunity to ask questions and all were answered. The patient agreed with the plan and demonstrated an understanding of the instructions.   The patient was advised to call back or seek an in-person evaluation if the symptoms worsen or if the condition fails to improve as anticipated.  Visit Diagnosis: 1. Thrombocytopenia ([place])     Dr. Randa Evens, MD, MPH [facility] Tel- [phone] 08/04/2021 4:05 PM

## 2021-08-05 DIAGNOSIS — R0609 Other forms of dyspnea: Secondary | ICD-10-CM | POA: Diagnosis not present

## 2021-08-05 DIAGNOSIS — D696 Thrombocytopenia, unspecified: Secondary | ICD-10-CM | POA: Diagnosis not present

## 2021-08-05 DIAGNOSIS — J449 Chronic obstructive pulmonary disease, unspecified: Secondary | ICD-10-CM | POA: Diagnosis not present

## 2021-08-05 DIAGNOSIS — Z9981 Dependence on supplemental oxygen: Secondary | ICD-10-CM | POA: Diagnosis not present

## 2021-08-05 DIAGNOSIS — J310 Chronic rhinitis: Secondary | ICD-10-CM | POA: Diagnosis not present

## 2021-08-05 LAB — SURGICAL PATHOLOGY

## 2021-08-15 DIAGNOSIS — J449 Chronic obstructive pulmonary disease, unspecified: Secondary | ICD-10-CM | POA: Diagnosis not present

## 2021-08-15 DIAGNOSIS — J4521 Mild intermittent asthma with (acute) exacerbation: Secondary | ICD-10-CM | POA: Diagnosis not present

## 2021-11-03 ENCOUNTER — Other Ambulatory Visit: Payer: Self-pay

## 2021-11-03 ENCOUNTER — Inpatient Hospital Stay: Payer: PPO | Attending: Oncology

## 2021-11-03 DIAGNOSIS — D696 Thrombocytopenia, unspecified: Secondary | ICD-10-CM | POA: Insufficient documentation

## 2021-11-03 LAB — CBC WITH DIFFERENTIAL/PLATELET
Abs Immature Granulocytes: 0.06 10*3/uL (ref 0.00–0.07)
Basophils Absolute: 0.1 10*3/uL (ref 0.0–0.1)
Basophils Relative: 1 %
Eosinophils Absolute: 0.1 10*3/uL (ref 0.0–0.5)
Eosinophils Relative: 1 %
HCT: 39.9 % (ref 36.0–46.0)
Hemoglobin: 13.7 g/dL (ref 12.0–15.0)
Immature Granulocytes: 0 %
Lymphocytes Relative: 21 %
Lymphs Abs: 3.2 10*3/uL (ref 0.7–4.0)
MCH: 30.9 pg (ref 26.0–34.0)
MCHC: 34.3 g/dL (ref 30.0–36.0)
MCV: 90.1 fL (ref 80.0–100.0)
Monocytes Absolute: 0.9 10*3/uL (ref 0.1–1.0)
Monocytes Relative: 6 %
Neutro Abs: 11 10*3/uL — ABNORMAL HIGH (ref 1.7–7.7)
Neutrophils Relative %: 71 %
Platelets: 64 10*3/uL — ABNORMAL LOW (ref 150–400)
RBC: 4.43 MIL/uL (ref 3.87–5.11)
RDW: 12.2 % (ref 11.5–15.5)
WBC: 15.3 10*3/uL — ABNORMAL HIGH (ref 4.0–10.5)
nRBC: 0 % (ref 0.0–0.2)

## 2021-11-06 DIAGNOSIS — R0609 Other forms of dyspnea: Secondary | ICD-10-CM | POA: Diagnosis not present

## 2021-11-14 DIAGNOSIS — J4521 Mild intermittent asthma with (acute) exacerbation: Secondary | ICD-10-CM | POA: Diagnosis not present

## 2021-11-14 DIAGNOSIS — J449 Chronic obstructive pulmonary disease, unspecified: Secondary | ICD-10-CM | POA: Diagnosis not present

## 2021-11-27 DIAGNOSIS — E785 Hyperlipidemia, unspecified: Secondary | ICD-10-CM | POA: Diagnosis not present

## 2021-11-27 DIAGNOSIS — R0602 Shortness of breath: Secondary | ICD-10-CM | POA: Diagnosis not present

## 2021-11-27 DIAGNOSIS — I10 Essential (primary) hypertension: Secondary | ICD-10-CM | POA: Diagnosis not present

## 2021-11-27 DIAGNOSIS — R002 Palpitations: Secondary | ICD-10-CM | POA: Diagnosis not present

## 2021-11-27 DIAGNOSIS — R058 Other specified cough: Secondary | ICD-10-CM | POA: Diagnosis not present

## 2021-11-27 DIAGNOSIS — K219 Gastro-esophageal reflux disease without esophagitis: Secondary | ICD-10-CM | POA: Diagnosis not present

## 2021-11-27 DIAGNOSIS — J4530 Mild persistent asthma, uncomplicated: Secondary | ICD-10-CM | POA: Diagnosis not present

## 2021-11-27 DIAGNOSIS — I208 Other forms of angina pectoris: Secondary | ICD-10-CM | POA: Diagnosis not present

## 2021-12-08 DIAGNOSIS — R0602 Shortness of breath: Secondary | ICD-10-CM | POA: Diagnosis not present

## 2021-12-08 DIAGNOSIS — I208 Other forms of angina pectoris: Secondary | ICD-10-CM | POA: Diagnosis not present

## 2021-12-15 DIAGNOSIS — J4530 Mild persistent asthma, uncomplicated: Secondary | ICD-10-CM | POA: Diagnosis not present

## 2021-12-15 DIAGNOSIS — K219 Gastro-esophageal reflux disease without esophagitis: Secondary | ICD-10-CM | POA: Diagnosis not present

## 2021-12-15 DIAGNOSIS — J449 Chronic obstructive pulmonary disease, unspecified: Secondary | ICD-10-CM | POA: Diagnosis not present

## 2021-12-15 DIAGNOSIS — I208 Other forms of angina pectoris: Secondary | ICD-10-CM | POA: Diagnosis not present

## 2021-12-15 DIAGNOSIS — J4521 Mild intermittent asthma with (acute) exacerbation: Secondary | ICD-10-CM | POA: Diagnosis not present

## 2021-12-15 DIAGNOSIS — E785 Hyperlipidemia, unspecified: Secondary | ICD-10-CM | POA: Diagnosis not present

## 2021-12-15 DIAGNOSIS — R0602 Shortness of breath: Secondary | ICD-10-CM | POA: Diagnosis not present

## 2021-12-22 IMAGING — CT CT CHEST W/O CM
1 of 3 series · 14 of 33 positions shown, 18 images · non-contrast
Comparison: No priors.

CLINICAL DATA: 70-year-old female with history of low oxygen
saturations for the past 2-3 months.

EXAM:
CT CHEST WITHOUT CONTRAST
TECHNIQUE: Multidetector CT imaging of the chest was performed following the
standard protocol without IV contrast.

[Series 2: thorax · axial · 0.69mm/px · z∈[-931,-645]mm · 14 of 157 slices shown, 18 images]
[im 7/157  mediastinal]
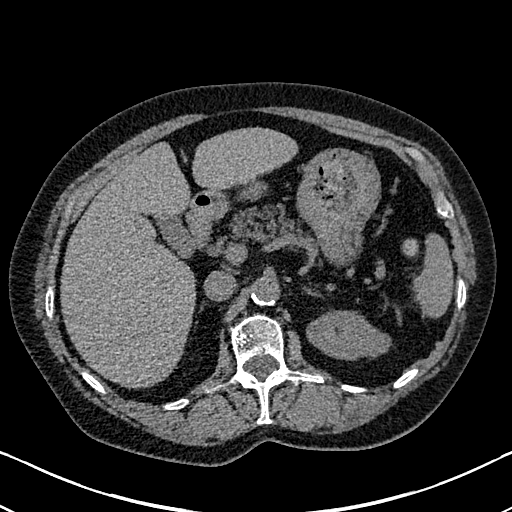
[im 7/157  lung]
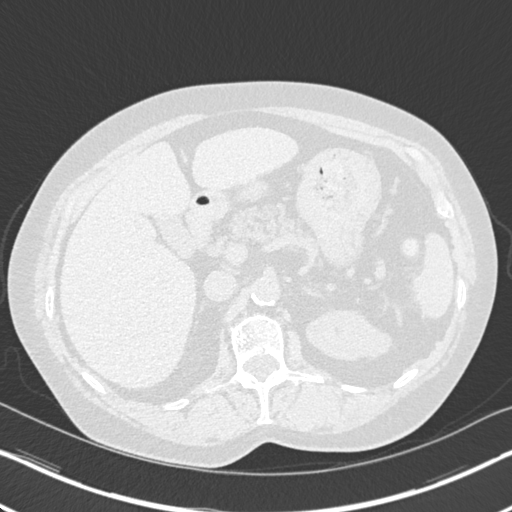
[im 21/157  lung]
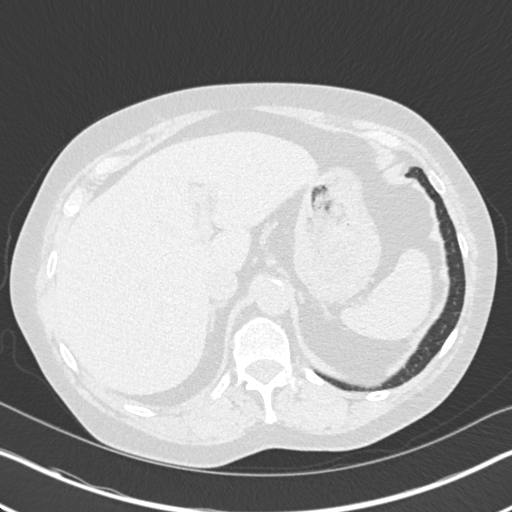
[im 34/157  lung]
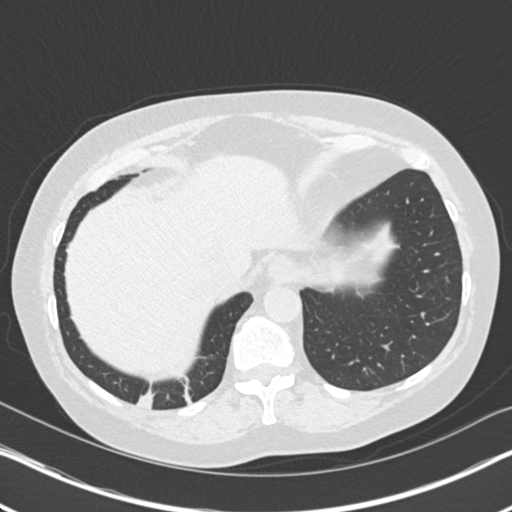
[im 48/157  lung]
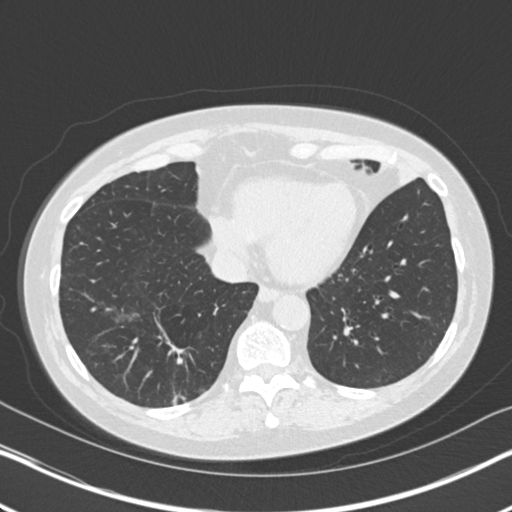
[im 55/157  mediastinal]
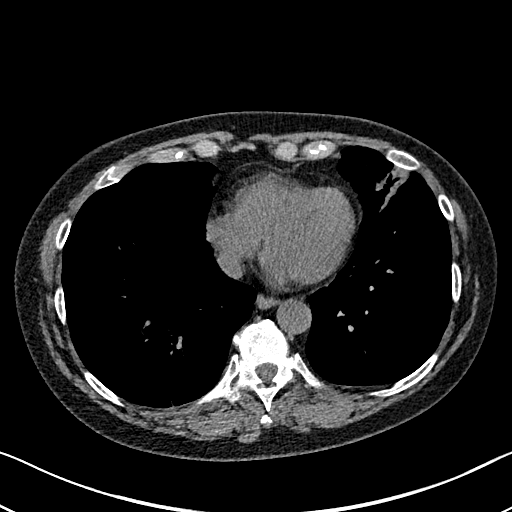
[im 55/157  lung]
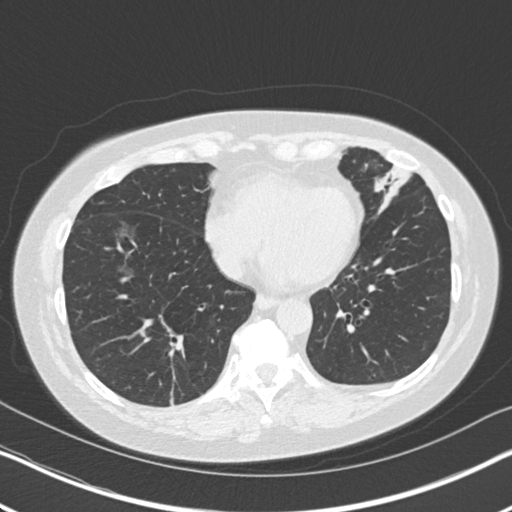
[im 62/157  lung]
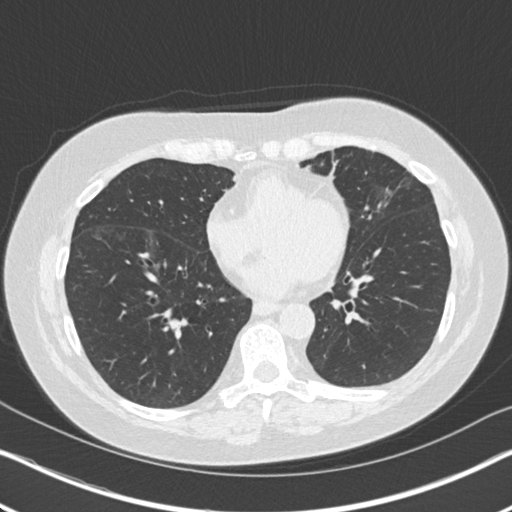
[im 74/157  lung]
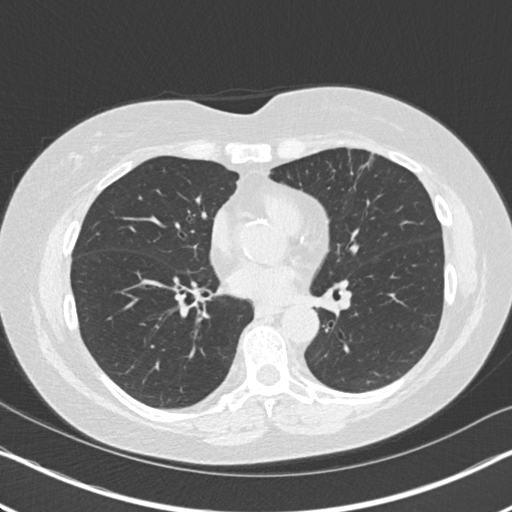
[im 82/157  lung]
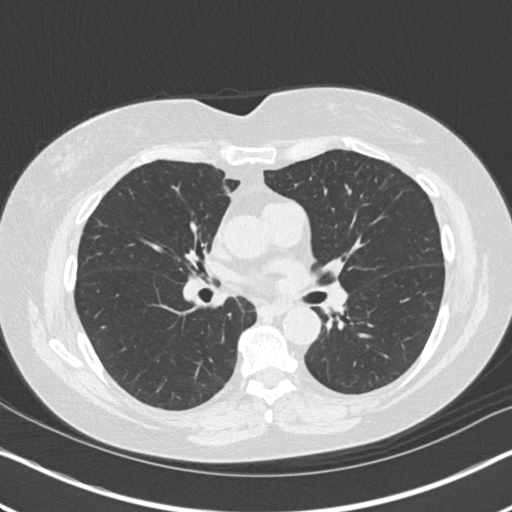
[im 95/157  mediastinal]
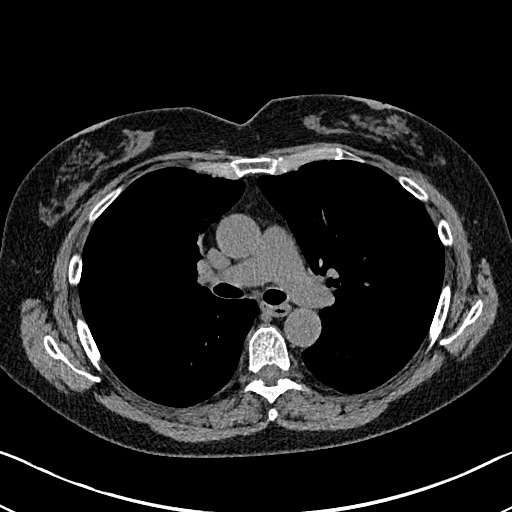
[im 95/157  lung]
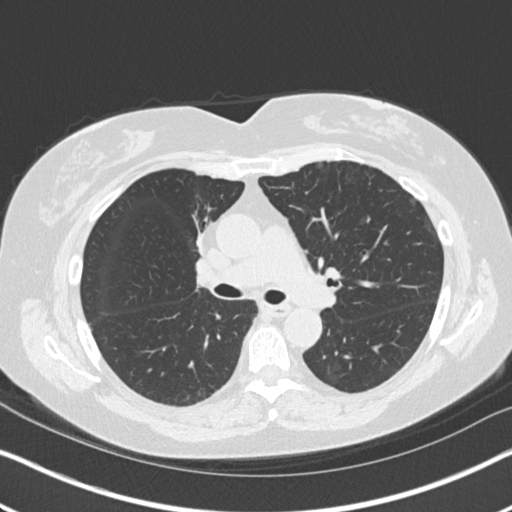
[im 102/157  lung]
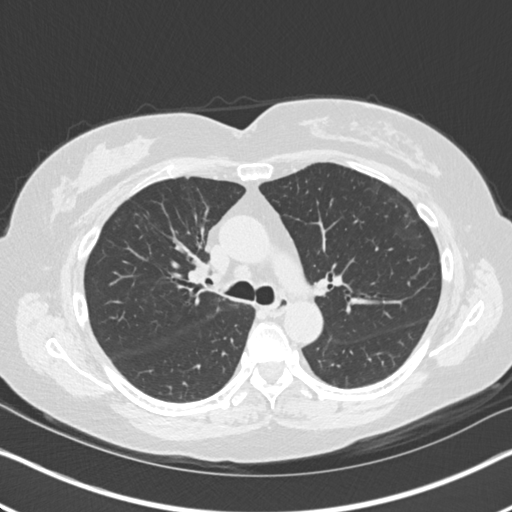
[im 109/157  lung]
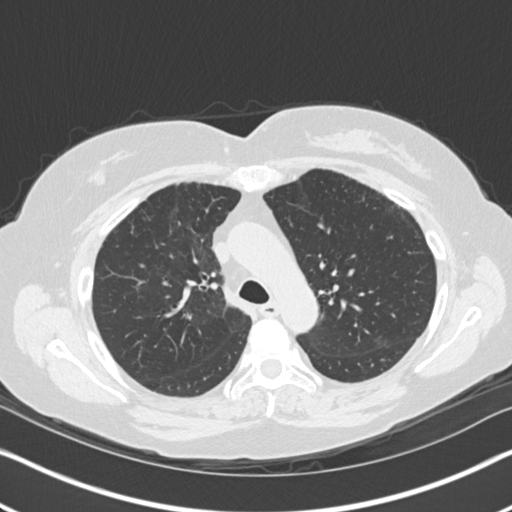
[im 123/157  lung]
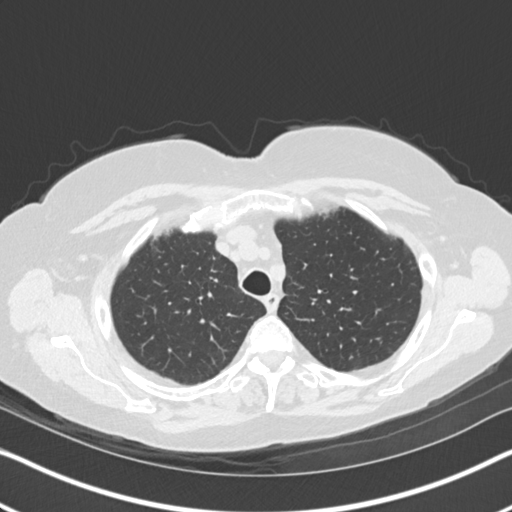
[im 136/157  mediastinal]
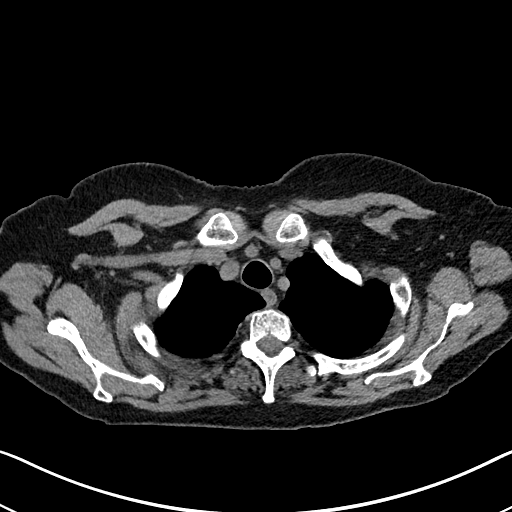
[im 136/157  lung]
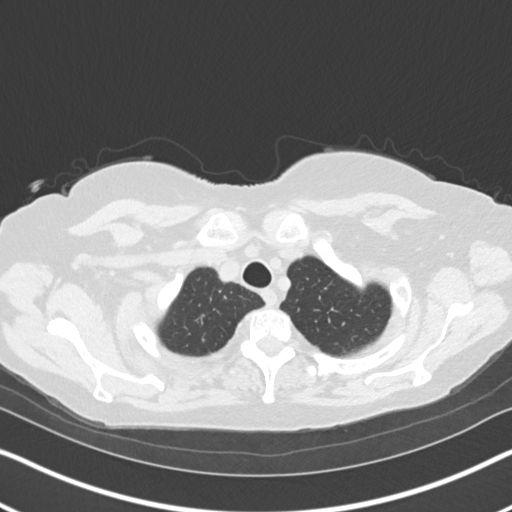
[im 150/157  lung]
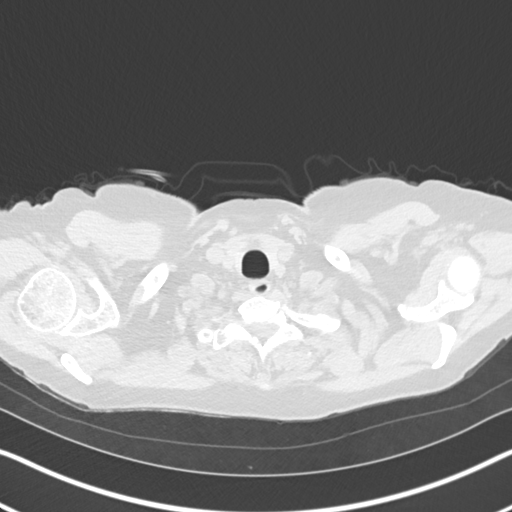

[14 of 33 positions shown; findings below may reference images not displayed]

FINDINGS: Cardiovascular: Heart size is normal. There is no significant
pericardial fluid, thickening or pericardial calcification. There is
aortic atherosclerosis, as well as atherosclerosis of the great
vessels of the mediastinum and the coronary arteries, including
calcified atherosclerotic plaque in the left anterior descending and
right coronary arteries.

Mediastinum/Nodes: No pathologically enlarged mediastinal or hilar
lymph nodes. Please note that accurate exclusion of hilar adenopathy
is limited on noncontrast CT scans. Esophagus is unremarkable in
appearance. No axillary lymphadenopathy.

Lungs/Pleura: Scattered areas with mild cylindrical bronchiectasis
and some associated thickening of the peribronchovascular
interstitium with regional areas of architectural distortion and
volume loss scattered throughout the lungs bilaterally, most
pronounced in the inferior segment of the lingula, favored to
reflect areas of chronic post infectious or inflammatory scarring.
No more generalized regions of ground-glass attenuation, septal
thickening, subpleural reticulation or honeycombing. Inspiratory and
expiratory imaging demonstrates some air trapping indicative of
small airways disease. No acute consolidative airspace disease. No
pleural effusions. No definite suspicious appearing pulmonary
nodules or masses are noted.

Upper Abdomen: Aortic atherosclerosis.

Musculoskeletal: There are no aggressive appearing lytic or blastic
lesions noted in the visualized portions of the skeleton.
IMPRESSION: 1. The appearance of the lungs is suggestive of a chronic indolent
atypical infectious process such as MOFIDUL (mycobacterium avium
intracellulare), as above.
2. Mild air trapping indicative of mild small airways disease.
3. Aortic atherosclerosis, in addition to two vessel coronary artery
disease. Assessment for potential risk factor modification, dietary
therapy or pharmacologic therapy may be warranted, if clinically
indicated.

Aortic Atherosclerosis (59893-O5S.S).

## 2022-01-15 DIAGNOSIS — J449 Chronic obstructive pulmonary disease, unspecified: Secondary | ICD-10-CM | POA: Diagnosis not present

## 2022-01-15 DIAGNOSIS — J4521 Mild intermittent asthma with (acute) exacerbation: Secondary | ICD-10-CM | POA: Diagnosis not present

## 2022-02-02 ENCOUNTER — Other Ambulatory Visit: Payer: PPO

## 2022-02-02 ENCOUNTER — Ambulatory Visit: Payer: PPO | Admitting: Nurse Practitioner

## 2022-02-12 ENCOUNTER — Inpatient Hospital Stay: Payer: PPO | Attending: Nurse Practitioner

## 2022-02-12 ENCOUNTER — Encounter: Payer: Self-pay | Admitting: Medical Oncology

## 2022-02-12 ENCOUNTER — Other Ambulatory Visit: Payer: Self-pay

## 2022-02-12 ENCOUNTER — Inpatient Hospital Stay: Payer: PPO | Admitting: Medical Oncology

## 2022-02-12 VITALS — BP 131/65 | HR 72 | Temp 96.9°F | Resp 16 | Ht 65.0 in | Wt 160.9 lb

## 2022-02-12 DIAGNOSIS — D696 Thrombocytopenia, unspecified: Secondary | ICD-10-CM | POA: Diagnosis not present

## 2022-02-12 DIAGNOSIS — J449 Chronic obstructive pulmonary disease, unspecified: Secondary | ICD-10-CM | POA: Diagnosis not present

## 2022-02-12 DIAGNOSIS — D72828 Other elevated white blood cell count: Secondary | ICD-10-CM

## 2022-02-12 DIAGNOSIS — Z79899 Other long term (current) drug therapy: Secondary | ICD-10-CM | POA: Insufficient documentation

## 2022-02-12 DIAGNOSIS — D729 Disorder of white blood cells, unspecified: Secondary | ICD-10-CM | POA: Diagnosis not present

## 2022-02-12 DIAGNOSIS — J4521 Mild intermittent asthma with (acute) exacerbation: Secondary | ICD-10-CM | POA: Diagnosis not present

## 2022-02-12 LAB — CBC WITH DIFFERENTIAL/PLATELET
Abs Immature Granulocytes: 0.05 10*3/uL (ref 0.00–0.07)
Basophils Absolute: 0.1 10*3/uL (ref 0.0–0.1)
Basophils Relative: 1 %
Eosinophils Absolute: 0.1 10*3/uL (ref 0.0–0.5)
Eosinophils Relative: 1 %
HCT: 42.1 % (ref 36.0–46.0)
Hemoglobin: 14.1 g/dL (ref 12.0–15.0)
Immature Granulocytes: 0 %
Lymphocytes Relative: 21 %
Lymphs Abs: 2.4 10*3/uL (ref 0.7–4.0)
MCH: 30.9 pg (ref 26.0–34.0)
MCHC: 33.5 g/dL (ref 30.0–36.0)
MCV: 92.1 fL (ref 80.0–100.0)
Monocytes Absolute: 0.5 10*3/uL (ref 0.1–1.0)
Monocytes Relative: 5 %
Neutro Abs: 8.2 10*3/uL — ABNORMAL HIGH (ref 1.7–7.7)
Neutrophils Relative %: 72 %
Platelets: 93 10*3/uL — ABNORMAL LOW (ref 150–400)
RBC: 4.57 MIL/uL (ref 3.87–5.11)
RDW: 12.6 % (ref 11.5–15.5)
WBC: 11.3 10*3/uL — ABNORMAL HIGH (ref 4.0–10.5)
nRBC: 0 % (ref 0.0–0.2)

## 2022-02-12 NOTE — Progress Notes (Signed)
Pt in for follow up, denies any concerns today. 

## 2022-02-12 NOTE — Progress Notes (Signed)
Chief Complaint: Discuss results of blood work- thrombocytopenia  ? ?History of present illness: Patient is a 71 year old female who was last seen by me in 2018 for leukocytosis mainly neutrophilia which was thought to be reactive.  Flow cytometry and BCR ABL testing did not reveal any abnormality.  She was then asked to follow-up with her PCP.  Since then her white count has remained stably elevated between 11-18 and it waxes and wanes.  Differential is mainly showed neutrophilia and lymphocytosis with no clear rising trend.  On her recent blood work from 07/01/2021 patient noted to have a white count of 13.1, H&H of 14/42.9 and a platelet count of 100.  Prior to that her platelet count was normal at 185 in July 2021.  She has therefore been referred to [place] for thrombocytopenia.  Patient denies any over-the-counter herbal supplements.  No recent changes in her medications.  She denies any bleeding or bruising. ? ?CBC showed platelet count of 120 with a normal white count and hemoglobin.  HIV and hepatitis C testing negative.  B12 and folate were normal. ? ?Interval history: ? She reports that she is doing well presently and denies any new complaints at this time. No bleeding, excessive bruising, hemoptysis, bloody or dark stools, excessive fatigue, fever, night sweats, N/V/D/C, rash, pain.  ? ? ?Review of Systems  ?Constitutional:  Negative for chills, fever, malaise/fatigue and weight loss.  ?HENT:  Negative for congestion, ear discharge and nosebleeds.   ?Eyes:  Negative for blurred vision.  ?Respiratory:  Negative for cough, hemoptysis, sputum production, shortness of breath and wheezing.   ?Cardiovascular:  Negative for chest pain, palpitations, orthopnea and claudication.  ?Gastrointestinal:  Negative for abdominal pain, blood in stool, constipation, diarrhea, heartburn, melena, nausea and vomiting.  ?Genitourinary:  Negative for dysuria, flank pain, frequency, hematuria and urgency.  ?Musculoskeletal:  Negative  for back pain, joint pain and myalgias.  ?Skin:  Negative for rash.  ?Neurological:  Negative for dizziness, tingling, focal weakness, seizures, weakness and headaches.  ?Endo/Heme/Allergies:  Does not bruise/bleed easily.  ?Psychiatric/Behavioral:  Negative for depression and suicidal ideas. The patient does not have insomnia.   ? ?Allergies  ?Allergen Reactions  ? Penicillins   ?  G  ? Topiramate Nausea Only  ?  Shaky, imbalnced  ? Penicillin G Rash  ? ? ?Past Medical History:  ?Diagnosis Date  ? Asthma   ? Barrett esophagus   ? Barrett's esophagus   ? C. difficile diarrhea 03/2016  ? hospitalized in [place] x 4 d w c diff after colonoscopy   ? COPD (chronic obstructive pulmonary disease) ([place])   ? Depression   ? Diarrhea   ? Diverticulosis   ? GERD (gastroesophageal reflux disease)   ? H/O seasonal allergies   ? H/O sinusitis   ? unspecified  ? Headache   ? migranes  ? History of hiatal hernia   ? Hyperlipidemia   ? Osteoporosis, postmenopausal   ? Shortness of breath dyspnea   ? Thrombocytopenia ([place]) 07/15/2021  ? Possible  ? Varicose vein   ? ? ?Past Surgical History:  ?Procedure Laterality Date  ? APPENDECTOMY    ? COLONOSCOPY WITH ESOPHAGOGASTRODUODENOSCOPY (EGD)    ? COLONOSCOPY WITH PROPOFOL N/A 03/30/2016  ? Procedure: COLONOSCOPY WITH PROPOFOL;  Surgeon: Lollie Sails, MD;  Location: [facility] ENDOSCOPY;  Service: Endoscopy;  Laterality: N/A;  ? COLONOSCOPY WITH PROPOFOL N/A 09/25/2016  ? Procedure: COLONOSCOPY WITH PROPOFOL;  Surgeon: Manya Silvas, MD;  Location: [facility] ENDOSCOPY;  Service: Endoscopy;  Laterality: N/A;  ? COLONOSCOPY WITH PROPOFOL N/A 08/01/2021  ? Procedure: COLONOSCOPY WITH PROPOFOL;  Surgeon: Lesly Rubenstein, MD;  Location: [facility] ENDOSCOPY;  Service: Endoscopy;  Laterality: N/A;  ? DILATION AND CURETTAGE OF UTERUS    ? ESOPHAGOGASTRODUODENOSCOPY (EGD) WITH PROPOFOL N/A 03/30/2016  ? Procedure: ESOPHAGOGASTRODUODENOSCOPY (EGD) WITH PROPOFOL;  Surgeon: Lollie Sails, MD;  Location: [facility]  ENDOSCOPY;  Service: Endoscopy;  Laterality: N/A;  ? ESOPHAGOGASTRODUODENOSCOPY (EGD) WITH PROPOFOL N/A 10/11/2019  ? Procedure: ESOPHAGOGASTRODUODENOSCOPY (EGD) WITH PROPOFOL;  Surgeon: Robert Bellow, MD;  Location: [facility] ENDOSCOPY;  Service: Endoscopy;  Laterality: N/A;  ? ESOPHAGOGASTRODUODENOSCOPY (EGD) WITH PROPOFOL N/A 08/01/2021  ? Procedure: ESOPHAGOGASTRODUODENOSCOPY (EGD) WITH PROPOFOL;  Surgeon: Lesly Rubenstein, MD;  Location: [facility] ENDOSCOPY;  Service: Endoscopy;  Laterality: N/A;  ? FECAL TRANSPLANT N/A 09/25/2016  ? Procedure: FECAL TRANSPLANT;  Surgeon: Manya Silvas, MD;  Location: [facility] ENDOSCOPY;  Service: Endoscopy;  Laterality: N/A;  ? TONSILLECTOMY    ? TUBAL LIGATION    ? ? ?Social History  ? ?Socioeconomic History  ? Marital status: Divorced  ?  Spouse name: Not on file  ? Number of children: Not on file  ? Years of education: Not on file  ? Highest education level: Not on file  ?Occupational History  ? Not on file  ?Tobacco Use  ? Smoking status: Never  ? Smokeless tobacco: Never  ?Vaping Use  ? Vaping Use: Never used  ?Substance and Sexual Activity  ? Alcohol use: No  ? Drug use: No  ? Sexual activity: Yes  ?Other Topics Concern  ? Not on file  ?Social History Narrative  ? Not on file  ? ?Social Determinants of Health  ? ?Financial Resource Strain: Not on file  ?Food Insecurity: Not on file  ?Transportation Needs: Not on file  ?Physical Activity: Not on file  ?Stress: Not on file  ?Social Connections: Not on file  ?Intimate Partner Violence: Not on file  ? ? ?Family History  ?Problem Relation Age of Onset  ? Emphysema Mother   ? Lung cancer Father   ? Emphysema Father   ? Atrial fibrillation Father   ? Atrial fibrillation Brother   ? ? ? ?Current Outpatient Medications:  ?  albuterol (PROVENTIL HFA;VENTOLIN HFA) 108 (90 Base) MCG/ACT inhaler, Inhale 2 puffs into the lungs every 6 (six) hours as needed for wheezing or shortness of breath., Disp: , Rfl:  ?  ALPRAZolam (XANAX) 0.5 MG  tablet, Take 0.5 mg by mouth 2 (two) times daily as needed for anxiety (Avoid daily use). , Disp: , Rfl:  ?  budesonide-formoterol (SYMBICORT) 160-4.5 MCG/ACT inhaler, Inhale 2 puffs into the lungs 2 (two) times daily., Disp: , Rfl:  ?  cyclobenzaprine (FLEXERIL) 5 MG tablet, Take 5 mg by mouth 3 (three) times daily as needed for muscle spasms., Disp: , Rfl:  ?  fexofenadine (ALLEGRA) 180 MG tablet, Take 180 mg by mouth daily., Disp: , Rfl:  ?  FLUoxetine (PROZAC) 40 MG capsule, TAKE 1 CAPSULE BY MOUTH EVERY DAY, Disp: , Rfl:  ?  HYDROcodone-acetaminophen (NORCO) 7.5-325 MG tablet, 1 tablet every 6 (six) hours as needed (Do not use every day). , Disp: , Rfl:  ?  montelukast (SINGULAIR) 10 MG tablet, Take 10 mg by mouth at bedtime., Disp: , Rfl:  ?  Multiple Vitamin (MULTIVITAMIN) tablet, Take 1 tablet by mouth daily., Disp: , Rfl:  ?  omeprazole (PRILOSEC) 20 MG capsule, Take 20 mg by mouth  daily., Disp: , Rfl:  ?  rizatriptan (MAXALT) 10 MG tablet, Take 10 mg by mouth as needed for migraine. May repeat in 2 hours if needed, Disp: , Rfl:  ?  simvastatin (ZOCOR) 20 MG tablet, Take 20 mg by mouth daily., Disp: , Rfl:  ?  traZODone (DESYREL) 100 MG tablet, Take 100 mg by mouth at bedtime., Disp: , Rfl:  ?  ondansetron (ZOFRAN-ODT) 4 MG disintegrating tablet, Take 4 mg by mouth every 8 (eight) hours as needed for nausea or vomiting. (Patient not taking: Reported on 08/04/2021), Disp: , Rfl:  ? ?No results found. ? ?No images are attached to the encounter. ? ? ?   ? View : No data to display.  ?  ?  ?  ? ? ?  Latest Ref Rng & Units 02/12/2022  ? 10:07 AM  ?CBC  ?WBC 4.0 - 10.5 K/uL 11.3    ?Hemoglobin 12.0 - 15.0 g/dL 14.1    ?Hematocrit 36.0 - 46.0 % 42.1    ?Platelets 150 - 400 K/uL 93    ? ?Physical Exam ?Vitals and nursing note reviewed.  ?Constitutional:   ?   General: She is not in acute distress. ?   Appearance: Normal appearance. She is not ill-appearing, toxic-appearing or diaphoretic.  ?HENT:  ?   Head:  Normocephalic and atraumatic.  ?Eyes:  ?   Comments: NO pallor   ?Cardiovascular:  ?   Rate and Rhythm: Normal rate and regular rhythm.  ?   Heart sounds: Normal heart sounds.  ?Pulmonary:  ?   Effort: Pulmonary eff

## 2022-03-09 DIAGNOSIS — J310 Chronic rhinitis: Secondary | ICD-10-CM | POA: Diagnosis not present

## 2022-03-09 DIAGNOSIS — R053 Chronic cough: Secondary | ICD-10-CM | POA: Diagnosis not present

## 2022-03-09 DIAGNOSIS — J449 Chronic obstructive pulmonary disease, unspecified: Secondary | ICD-10-CM | POA: Diagnosis not present

## 2022-03-10 DIAGNOSIS — E785 Hyperlipidemia, unspecified: Secondary | ICD-10-CM | POA: Diagnosis not present

## 2022-03-10 DIAGNOSIS — T7840XD Allergy, unspecified, subsequent encounter: Secondary | ICD-10-CM | POA: Diagnosis not present

## 2022-03-10 DIAGNOSIS — G43009 Migraine without aura, not intractable, without status migrainosus: Secondary | ICD-10-CM | POA: Diagnosis not present

## 2022-03-10 DIAGNOSIS — R7309 Other abnormal glucose: Secondary | ICD-10-CM | POA: Diagnosis not present

## 2022-03-10 DIAGNOSIS — J4530 Mild persistent asthma, uncomplicated: Secondary | ICD-10-CM | POA: Diagnosis not present

## 2022-03-10 DIAGNOSIS — K219 Gastro-esophageal reflux disease without esophagitis: Secondary | ICD-10-CM | POA: Diagnosis not present

## 2022-03-13 DIAGNOSIS — E785 Hyperlipidemia, unspecified: Secondary | ICD-10-CM | POA: Diagnosis not present

## 2022-03-13 DIAGNOSIS — R7309 Other abnormal glucose: Secondary | ICD-10-CM | POA: Diagnosis not present

## 2022-03-13 DIAGNOSIS — K219 Gastro-esophageal reflux disease without esophagitis: Secondary | ICD-10-CM | POA: Diagnosis not present

## 2022-03-15 DIAGNOSIS — J4521 Mild intermittent asthma with (acute) exacerbation: Secondary | ICD-10-CM | POA: Diagnosis not present

## 2022-03-15 DIAGNOSIS — J449 Chronic obstructive pulmonary disease, unspecified: Secondary | ICD-10-CM | POA: Diagnosis not present

## 2022-04-14 DIAGNOSIS — J4521 Mild intermittent asthma with (acute) exacerbation: Secondary | ICD-10-CM | POA: Diagnosis not present

## 2022-04-14 DIAGNOSIS — J449 Chronic obstructive pulmonary disease, unspecified: Secondary | ICD-10-CM | POA: Diagnosis not present

## 2022-05-15 DIAGNOSIS — J4521 Mild intermittent asthma with (acute) exacerbation: Secondary | ICD-10-CM | POA: Diagnosis not present

## 2022-05-15 DIAGNOSIS — J449 Chronic obstructive pulmonary disease, unspecified: Secondary | ICD-10-CM | POA: Diagnosis not present

## 2022-06-14 DIAGNOSIS — J4521 Mild intermittent asthma with (acute) exacerbation: Secondary | ICD-10-CM | POA: Diagnosis not present

## 2022-06-14 DIAGNOSIS — J449 Chronic obstructive pulmonary disease, unspecified: Secondary | ICD-10-CM | POA: Diagnosis not present

## 2022-07-09 DIAGNOSIS — J449 Chronic obstructive pulmonary disease, unspecified: Secondary | ICD-10-CM | POA: Diagnosis not present

## 2022-07-16 ENCOUNTER — Encounter: Payer: Self-pay | Admitting: Dermatology

## 2022-07-16 ENCOUNTER — Ambulatory Visit: Payer: PPO | Admitting: Dermatology

## 2022-07-16 DIAGNOSIS — L853 Xerosis cutis: Secondary | ICD-10-CM | POA: Diagnosis not present

## 2022-07-16 DIAGNOSIS — L111 Transient acantholytic dermatosis [Grover]: Secondary | ICD-10-CM

## 2022-07-16 DIAGNOSIS — D1800 Hemangioma unspecified site: Secondary | ICD-10-CM | POA: Diagnosis not present

## 2022-07-16 DIAGNOSIS — L821 Other seborrheic keratosis: Secondary | ICD-10-CM | POA: Diagnosis not present

## 2022-07-16 DIAGNOSIS — D229 Melanocytic nevi, unspecified: Secondary | ICD-10-CM | POA: Diagnosis not present

## 2022-07-16 DIAGNOSIS — L814 Other melanin hyperpigmentation: Secondary | ICD-10-CM

## 2022-07-16 DIAGNOSIS — D225 Melanocytic nevi of trunk: Secondary | ICD-10-CM

## 2022-07-16 DIAGNOSIS — L578 Other skin changes due to chronic exposure to nonionizing radiation: Secondary | ICD-10-CM | POA: Diagnosis not present

## 2022-07-16 MED ORDER — TRIAMCINOLONE ACETONIDE 0.1 % EX CREA: TOPICAL_CREAM | CUTANEOUS | 2 refills | Status: DC

## 2022-07-16 NOTE — Patient Instructions (Addendum)
Start Triamcinolone 0.1% cream twice daily up to 2 weeks as needed for rash. Avoid applying to face, groin, and axilla. Use as directed. Long-term use can cause thinning of the skin.  Topical steroids (such as triamcinolone, fluocinolone, fluocinonide, mometasone, clobetasol, halobetasol, betamethasone, hydrocortisone) can cause thinning and lightening of the skin if they are used for too long in the same area. Your physician has selected the right strength medicine for your problem and area affected on the body. Please use your medication only as directed by your physician to prevent side effects.    Recommend taking Heliocare sun protection supplement daily in sunny weather for additional sun protection. For maximum protection on the sunniest days, you can take up to 2 capsules of regular Heliocare OR take 1 capsule of Heliocare Ultra. For prolonged exposure (such as a full day in the sun), you can repeat your dose of the supplement 4 hours after your first dose. Heliocare can be purchased at [employer], at some [facility] or at [email or web address].     Recommend daily broad spectrum sunscreen SPF 30+ to sun-exposed areas, reapply every 2 hours as needed. Call for new or changing lesions.  Staying in the shade or wearing long sleeves, sun glasses (UVA+UVB protection) and wide brim hats (4-inch brim around the entire circumference of the hat) are also recommended for sun protection.    Gentle Skin Care Guide  1. Bathe no more than once a day.  2. Avoid bathing in hot water  3. Use a mild soap like Dove, Vanicream, Cetaphil, CeraVe. Can use Lever 2000 or Cetaphil antibacterial soap  4. Use soap only where you need it. On most days, use it under your arms, between your legs, and on your feet. Let the water rinse other areas unless visibly dirty.  5. When you get out of the bath/shower, use a towel to gently blot your skin dry, don't rub it.  6. While your skin is still a little damp,  apply a moisturizing cream such as Vanicream, CeraVe, Cetaphil, Eucerin, Sarna lotion or plain Vaseline Jelly. For hands apply Neutrogena [place] Hand Cream or Excipial Hand Cream.  7. Reapply moisturizer any time you start to itch or feel dry.  8. Sometimes using free and clear laundry detergents can be helpful. Fabric softener sheets should be avoided. Downy Free & Gentle liquid, or any liquid fabric softener that is free of dyes and perfumes, it acceptable to use  9. If your doctor has given you prescription creams you may apply moisturizers over them       Melanoma ABCDEs  Melanoma is the most dangerous type of skin cancer, and is the leading cause of death from skin disease.  You are more likely to develop melanoma if you: Have light-colored skin, light-colored eyes, or red or blond hair Spend a lot of time in the sun Tan regularly, either outdoors or in a tanning bed Have had blistering sunburns, especially during childhood Have a close family member who has had a melanoma Have atypical moles or large birthmarks  Early detection of melanoma is key since treatment is typically straightforward and cure rates are extremely high if we catch it early.   The first sign of melanoma is often a change in a mole or a new dark spot.  The ABCDE system is a way of remembering the signs of melanoma.  A for asymmetry:  The two halves do not match. B for border:  The edges of the growth are irregular.  C for color:  A mixture of colors are present instead of an even brown color. D for diameter:  Melanomas are usually (but not always) greater than 38m - the size of a pencil eraser. E for evolution:  The spot keeps changing in size, shape, and color.  Please check your skin once per month between visits. You can use a small mirror in front and a large mirror behind you to keep an eye on the back side or your body.   If you see any new or changing lesions before your next follow-up, please call  to schedule a visit.  Please continue daily skin protection including broad spectrum sunscreen SPF 30+ to sun-exposed areas, reapplying every 2 hours as needed when you're outdoors.   Staying in the shade or wearing long sleeves, sun glasses (UVA+UVB protection) and wide brim hats (4-inch brim around the entire circumference of the hat) are also recommended for sun protection.     Due to recent changes in healthcare laws, you may see results of your pathology and/or laboratory studies on MyChart before the doctors have had a chance to review them. We understand that in some cases there may be results that are confusing or concerning to you. Please understand that not all results are received at the same time and often the doctors may need to interpret multiple results in order to provide you with the best plan of care or course of treatment. Therefore, we ask that you please give u[place]2 business days to thoroughly review all your results before contacting the office for clarification. Should we see a critical lab result, you will be contacted sooner.   If You Need Anything After Your Visit  If you have any questions or concerns for your doctor, please call our main line at [phone] press option 4 to reach your doctor's medical assistant. If no one answers, please leave a voicemail as directed and we will return your call as soon as possible. Messages left after 4 pm will be answered the following business day.   You may also send [place] message via [facility] We typically respond to MyChart messages within 1-2 business days.  For prescription refills, please ask your pharmacy to contact our office. Our fax number is [phone]  If you have an urgent issue when the clinic is closed that cannot wait until the next business day, you can page your doctor at the number below.    Please note that while we do our best to be available for urgent issues outside of office hours, we are not available  24/7.   If you have an urgent issue and are unable to reach u[place] you may choose to seek medical care at your doctor's office, retail clinic, urgent care center, or emergency room.  If you have a medical emergency, please immediately call 911 or go to the emergency department.  Pager Numbers  - Dr. KNehemiah Massed [phone] - Dr. MLaurence Ferrari [phone] - Dr. SNicole Kindred [phone] In the event of inclement weather, please call our main line at [phone] an update on the status of any delays or closures.  Dermatology Medication Tips: Please keep the boxes that topical medications come in in order to help keep track of the instructions about where and how to use these. Pharmacies typically print the medication instructions only on the boxes and not directly on the medication tubes.   If your medication is too expensive, please contact our office at [phone]  option 4 or send [place] a message through [employer].   We are unable to tell what your co-pay for medications will be in advance as this is different depending on your insurance coverage. However, we may be able to find a substitute medication at lower cost or fill out paperwork to get insurance to cover a needed medication.   If a prior authorization is required to get your medication covered by your insurance company, please allow [place] 1-2 business days to complete this process.  Drug prices often vary depending on where the prescription is filled and some pharmacies may offer cheaper prices.  The website www.goodrx.com contains coupons for medications through different pharmacies. The prices here do not account for what the cost may be with help from insurance (it may be cheaper with your insurance), but the website can give you the price if you did not use any insurance.  - You can print the associated coupon and take it with your prescription to the pharmacy.  - You may also stop by our office during regular business hours and pick up  a GoodRx coupon card.  - If you need your prescription sent electronically to a different pharmacy, notify our office through [facility] or by phone at [phone] option 4.     Si Usted Necesita Algo Despus de Su Visita  Tambin puede enviarnos un mensaje a travs de Pharmacist, community. Por lo general respondemos a los mensajes de MyChart en el transcurso de 1 a 2 das hbiles.  Para renovar recetas, por favor pida a su farmacia que se ponga en contacto con nuestra oficina. Harland Dingwall de fax es [place] [phone].  Si tiene un asunto urgente cuando la clnica est cerrada y que no puede esperar hasta el siguiente da hbil, puede llamar/localizar a su doctor(a) al nmero que aparece a continuacin.   Por favor, tenga en cuenta que aunque hacemos todo lo posible para estar disponibles para asuntos urgentes fuera del horario de [place], no estamos disponibles las 24 horas del da, los 7 das de la [place].   Si tiene un problema urgente y no puede comunicarse con nosotros, puede optar por buscar atencin mdica  en el consultorio de su doctor(a), en una clnica privada, en un centro de atencin urgente o en una sala de emergencias.  Si tiene Engineering geologist, por favor llame inmediatamente al 911 o vaya a la sala de emergencias.  Nmeros de bper  - Dr. Nehemiah Massed: [phone]  - Dra. Moye: [phone]  - Dra. Nicole Kindred: [phone]  En caso de inclemencias del [place], por favor llame a Johnsie Kindred principal al [phone] para una actualizacin sobre el [place] de cualquier retraso o cierre.  Consejos para la medicacin en dermatologa: Por favor, guarde las cajas en las que vienen los medicamentos de uso tpico para ayudarle a seguir las instrucciones sobre dnde y cmo usarlos. Las farmacias generalmente imprimen las instrucciones del medicamento slo en las cajas y no directamente en los tubos del [place].   Si su medicamento es muy caro, por favor, pngase en contacto  con Zigmund Daniel llamando al [phone] y presione la opcin 4 o envenos un mensaje a travs de Pharmacist, community.   No podemos decirle cul ser su copago por los medicamentos por adelantado ya que esto es diferente dependiendo de la cobertura de su seguro. Sin embargo, es posible que podamos encontrar un medicamento sustituto a Electrical engineer un formulario para que el seguro cubra el medicamento que se considera necesario.  Si se requiere una autorizacin previa para que su compaa de seguros [place] su medicamento, por favor permtanos de 1 a 2 das hbiles para completar este proceso.  Los precios de los medicamentos varan con frecuencia dependiendo del Environmental consultant de dnde se surte la receta y alguna farmacias pueden ofrecer precios ms baratos.  El sitio web www.goodrx.com tiene cupones para medicamentos de Airline pilot. Los precios aqu no tienen en cuenta lo que podra costar con la ayuda del seguro (puede ser ms barato con su seguro), pero el sitio web puede darle el precio si no utiliz Research scientist (physical sciences).  - Puede imprimir el cupn correspondiente y llevarlo con su receta a la farmacia.  - Tambin puede pasar por nuestra oficina durante el horario de atencin regular y Charity fundraiser una tarjeta de cupones de GoodRx.  - Si necesita que su receta se enve electrnicamente a una farmacia diferente, informe a nuestra oficina a travs de MyChart de [place] o por telfono llamando al [phone] y presione la opcin 4.

## 2022-07-16 NOTE — Progress Notes (Signed)
Follow-Up Visit   Subjective  Carly Hoffman is a 71 y.o. female who presents for the following: Annual Exam (No personal Hx of skin cancer or DN. Area on breast to recheck. States has been getting blister-like spots on chest, randomly ).  The patient presents for Total-Body Skin Exam (TBSE) for skin cancer screening and mole check.  The patient has spots, moles and lesions to be evaluated, some may be new or changing and the patient has concerns that these could be cancer.  The following portions of the chart were reviewed this encounter and updated as appropriate:  Tobacco  Allergies  Meds  Problems  Med Hx  Surg Hx  Fam Hx      Review of Systems: No other skin or systemic complaints except as noted in HPI or Assessment and Plan.   Objective  Well appearing patient in no apparent distress; mood and affect are within normal limits.  A full examination was performed including scalp, head, eyes, ears, nose, lips, neck, chest, axillae, abdomen, back, buttocks, bilateral upper extremities, bilateral lower extremities, hands, feet, fingers, toes, fingernails, and toenails. All findings within normal limits unless otherwise noted below.  Left Breast 1.3 cm pink plaque  chest Red papules and papulovesicles.   scattered body Dryness at body   Assessment & Plan   Lentigines - Scattered tan macules - Due to sun exposure - Benign-appearing, observe - Recommend daily broad spectrum sunscreen SPF 30+ to sun-exposed areas, reapply every 2 hours as needed. - Call for any changes  Seborrheic Keratoses - Stuck-on, waxy, tan-brown papules and/or plaques  - Benign-appearing - Discussed benign etiology and prognosis. - Observe - Call for any changes  Melanocytic Nevi - Tan-brown and/or pink-flesh-colored symmetric macules and papules - Benign appearing on exam today - Observation - Call clinic for new or changing moles - Recommend daily use of broad spectrum spf 30+  sunscreen to sun-exposed areas.   Hemangiomas - Red papules - Discussed benign nature - Observe - Call for any changes  Actinic Damage - Chronic condition, secondary to cumulative UV/sun exposure - diffuse scaly erythematous macules with underlying dyspigmentation - Recommend daily broad spectrum sunscreen SPF 30+ to sun-exposed areas, reapply every 2 hours as needed.  - Staying in the shade or wearing long sleeves, sun glasses (UVA+UVB protection) and wide brim hats (4-inch brim around the entire circumference of the hat) are also recommended for sun protection.  - Call for new or changing lesions.  Skin cancer screening performed today.  Nevus Left Breast  Benign-appearing.  Observation.  Call clinic for new or changing lesions.  Recommend daily use of broad spectrum spf 30+ sunscreen to sun-exposed areas.    Grover's disease chest  Discussed benign rash. No cure, just symptom relief.  Start Triamcinolone 0.1% cream twice daily up to 2 weeks as needed for rash. Avoid applying to face, groin, and axilla. Use as directed. Long-term use can cause thinning of the skin.  Topical steroids (such as triamcinolone, fluocinolone, fluocinonide, mometasone, clobetasol, halobetasol, betamethasone, hydrocortisone) can cause thinning and lightening of the skin if they are used for too long in the same area. Your physician has selected the right strength medicine for your problem and area affected on the body. Please use your medication only as directed by your physician to prevent side effects.    triamcinolone cream (KENALOG) 0.1 % - chest Apply twice daily to affected area up to 2 weeks as needed for rash. Avoid applying to face, groin,  and axilla. Use as directed.  Xerosis of skin scattered body  Gentle skin care guide given.    Return for TBSE 1-2 years.  I, Emelia Salisbury, CMA, am acting as scribe for Forest Gleason, MD.   Documentation: I have reviewed the above documentation for  accuracy and completeness, and I agree with the above.  Forest Gleason, MD

## 2022-07-20 ENCOUNTER — Encounter: Payer: Self-pay | Admitting: Dermatology

## 2022-08-17 ENCOUNTER — Inpatient Hospital Stay: Payer: PPO | Attending: Oncology

## 2022-08-17 ENCOUNTER — Inpatient Hospital Stay: Payer: PPO | Admitting: Oncology

## 2022-08-17 ENCOUNTER — Encounter: Payer: Self-pay | Admitting: Oncology

## 2022-08-17 VITALS — BP 122/67 | HR 66 | Temp 98.3°F | Resp 16 | Ht 65.0 in | Wt 164.0 lb

## 2022-08-17 DIAGNOSIS — D693 Immune thrombocytopenic purpura: Secondary | ICD-10-CM | POA: Diagnosis not present

## 2022-08-17 DIAGNOSIS — D696 Thrombocytopenia, unspecified: Secondary | ICD-10-CM | POA: Diagnosis not present

## 2022-08-17 DIAGNOSIS — D729 Disorder of white blood cells, unspecified: Secondary | ICD-10-CM

## 2022-08-17 LAB — CBC WITH DIFFERENTIAL/PLATELET
Abs Immature Granulocytes: 0.04 10*3/uL (ref 0.00–0.07)
Basophils Absolute: 0.1 10*3/uL (ref 0.0–0.1)
Basophils Relative: 1 %
Eosinophils Absolute: 0.2 10*3/uL (ref 0.0–0.5)
Eosinophils Relative: 2 %
HCT: 40.3 % (ref 36.0–46.0)
Hemoglobin: 13.8 g/dL (ref 12.0–15.0)
Immature Granulocytes: 0 %
Lymphocytes Relative: 24 %
Lymphs Abs: 2.8 10*3/uL (ref 0.7–4.0)
MCH: 30.7 pg (ref 26.0–34.0)
MCHC: 34.2 g/dL (ref 30.0–36.0)
MCV: 89.8 fL (ref 80.0–100.0)
Monocytes Absolute: 0.5 10*3/uL (ref 0.1–1.0)
Monocytes Relative: 5 %
Neutro Abs: 7.8 10*3/uL — ABNORMAL HIGH (ref 1.7–7.7)
Neutrophils Relative %: 68 %
Platelets: 84 10*3/uL — ABNORMAL LOW (ref 150–400)
RBC: 4.49 MIL/uL (ref 3.87–5.11)
RDW: 12.4 % (ref 11.5–15.5)
WBC: 11.4 10*3/uL — ABNORMAL HIGH (ref 4.0–10.5)
nRBC: 0 % (ref 0.0–0.2)

## 2022-08-17 NOTE — Progress Notes (Signed)
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  Flow cytometry and BCR ABL testing did not reveal any abnormality.  She was then asked to follow-up with her PCP.  Since then her white count has remained stably elevated between 11-18 and it waxes and wanes.  Differential is mainly showed neutrophilia and lymphocytosis with no clear rising trend.  On her recent blood work from 07/01/2021 patient noted to have a white count of 13.1, H&H of 14/42.9 and a platelet count of 100.  Prior to that her platelet count was normal at 185 in July 2021.  She has therefore been referred to [place] for thrombocytopenia.  Patient denies any over-the-counter herbal supplements.  No recent changes in her medications.  She denies any bleeding or bruising.  Interval history-patient is doing well and denies any specific complaints at this time.  Denies any bleeding or bruising.  ECOG PS- 0 Pain scale- 0   Review of systems- Review of Systems  Constitutional:  Negative for chills, fever, malaise/fatigue and weight loss.  HENT:  Negative for congestion, ear discharge and nosebleeds.   Eyes:  Negative for blurred vision.  Respiratory:  Negative for cough, hemoptysis, sputum production, shortness of breath and wheezing.   Cardiovascular:  Negative for chest pain, palpitations, orthopnea and claudication.  Gastrointestinal:  Negative for abdominal pain, blood in stool, constipation, diarrhea, heartburn, melena, nausea and  vomiting.  Genitourinary:  Negative for dysuria, flank pain, frequency, hematuria and urgency.  Musculoskeletal:  Negative for back pain, joint pain and myalgias.  Skin:  Negative for rash.  Neurological:  Negative for dizziness, tingling, focal weakness, seizures, weakness and headaches.  Endo/Heme/Allergies:  Does not bruise/bleed easily.  Psychiatric/Behavioral:  Negative for depression and suicidal ideas. The patient does not have insomnia.       Allergies  Allergen Reactions   Topiramate Nausea Only    Shaky, imbalnced   Penicillin G Rash     Past Medical History:  Diagnosis Date   Asthma    Barrett esophagus    Barrett's esophagus    C. difficile diarrhea 03/2016   hospitalized in [place] x 4 d w c diff after colonoscopy    COPD (chronic obstructive pulmonary disease) (HCC)    Depression    Diarrhea    Diverticulosis    GERD (gastroesophageal reflux disease)    H/O seasonal allergies    H/O sinusitis    unspecified   Headache    migranes   History of hiatal hernia    Hyperlipidemia    Osteoporosis, postmenopausal    Shortness of breath dyspnea    Thrombocytopenia (HCC) 07/15/2021   Possible   Thrombocytopenia (HCC)    Varicose vein      Past Surgical History:  Procedure Laterality Date   APPENDECTOMY     COLONOSCOPY WITH ESOPHAGOGASTRODUODENOSCOPY (EGD)     COLONOSCOPY WITH PROPOFOL N/A 03/30/2016   Procedure: COLONOSCOPY WITH PROPOFOL;  Surgeon: Lollie Sails, MD;  Location: [facility] ENDOSCOPY;  Service: Endoscopy;  Laterality: N/A;   COLONOSCOPY WITH PROPOFOL N/A  09/25/2016   Procedure: COLONOSCOPY WITH PROPOFOL;  Surgeon: Manya Silvas, MD;  Location: [facility] ENDOSCOPY;  Service: Endoscopy;  Laterality: N/A;   COLONOSCOPY WITH PROPOFOL N/A 08/01/2021   Procedure: COLONOSCOPY WITH PROPOFOL;  Surgeon: Lesly Rubenstein, MD;  Location: [facility] ENDOSCOPY;  Service: Endoscopy;  Laterality: N/A;   DILATION AND CURETTAGE OF UTERUS     ESOPHAGOGASTRODUODENOSCOPY (EGD)  WITH PROPOFOL N/A 03/30/2016   Procedure: ESOPHAGOGASTRODUODENOSCOPY (EGD) WITH PROPOFOL;  Surgeon: Lollie Sails, MD;  Location: [facility] ENDOSCOPY;  Service: Endoscopy;  Laterality: N/A;   ESOPHAGOGASTRODUODENOSCOPY (EGD) WITH PROPOFOL N/A 10/11/2019   Procedure: ESOPHAGOGASTRODUODENOSCOPY (EGD) WITH PROPOFOL;  Surgeon: Robert Bellow, MD;  Location: [facility] ENDOSCOPY;  Service: Endoscopy;  Laterality: N/A;   ESOPHAGOGASTRODUODENOSCOPY (EGD) WITH PROPOFOL N/A 08/01/2021   Procedure: ESOPHAGOGASTRODUODENOSCOPY (EGD) WITH PROPOFOL;  Surgeon: Lesly Rubenstein, MD;  Location: [facility] ENDOSCOPY;  Service: Endoscopy;  Laterality: N/A;   FECAL TRANSPLANT N/A 09/25/2016   Procedure: FECAL TRANSPLANT;  Surgeon: Manya Silvas, MD;  Location: [facility] ENDOSCOPY;  Service: Endoscopy;  Laterality: N/A;   TONSILLECTOMY     TUBAL LIGATION      Social History   Socioeconomic History   Marital status: Divorced    Spouse name: Not on file   Number of children: Not on file   Years of education: Not on file   Highest education level: Not on file  Occupational History   Not on file  Tobacco Use   Smoking status: Never   Smokeless tobacco: Never  Vaping Use   Vaping Use: Never used  Substance and Sexual Activity   Alcohol use: No   Drug use: No   Sexual activity: Yes  Other Topics Concern   Not on file  Social History Narrative   Not on file   Social Determinants of Health   Financial Resource Strain: Not on file  Food Insecurity: Not on file  Transportation Needs: Not on file  Physical Activity: Not on file  Stress: Not on file  Social Connections: Not on file  Intimate Partner Violence: Not on file    Family History  Problem Relation Age of Onset   Emphysema Mother    Lung cancer Father    Emphysema Father    Atrial fibrillation Father    Atrial fibrillation Brother      Current Outpatient Medications:    albuterol (PROVENTIL HFA;VENTOLIN HFA) 108 (90 Base) MCG/ACT inhaler, Inhale  2 puffs into the lungs every 6 (six) hours as needed for wheezing or shortness of breath., Disp: , Rfl:    ALPRAZolam (XANAX) 0.25 MG tablet, TAKE 1/2 TO 1 TABLET BY MOUTH ONCE DAILY AS NEEDED, Disp: , Rfl:    budesonide-formoterol (SYMBICORT) 160-4.5 MCG/ACT inhaler, Inhale 1 puff into the lungs 2 (two) times daily., Disp: , Rfl:    cyclobenzaprine (FLEXERIL) 5 MG tablet, Take 5 mg by mouth 3 (three) times daily as needed for muscle spasms., Disp: , Rfl:    fexofenadine (ALLEGRA) 180 MG tablet, Take 180 mg by mouth daily., Disp: , Rfl:    FLUoxetine (PROZAC) 40 MG capsule, Take 1 tablet by mouth daily., Disp: , Rfl:    HYDROcodone-acetaminophen (NORCO) 7.5-325 MG tablet, 1 tablet every 6 (six) hours as needed (Do not use every day). , Disp: , Rfl:    montelukast (SINGULAIR) 10 MG tablet, Take 10 mg by mouth at bedtime., Disp: , Rfl:    Multiple Vitamin (MULTIVITAMIN) tablet, Take 1 tablet by mouth daily., Disp: , Rfl:  omeprazole (PRILOSEC) 20 MG capsule, Take 20 mg by mouth 2 (two) times daily before a meal., Disp: , Rfl:    ondansetron (ZOFRAN-ODT) 4 MG disintegrating tablet, Take 4 mg by mouth every 8 (eight) hours as needed for nausea or vomiting., Disp: , Rfl:    rizatriptan (MAXALT) 10 MG tablet, Take 10 mg by mouth as needed for migraine. May repeat in 2 hours if needed, Disp: , Rfl:    simvastatin (ZOCOR) 20 MG tablet, Take 20 mg by mouth daily., Disp: , Rfl:    traZODone (DESYREL) 100 MG tablet, Take 100 mg by mouth at bedtime., Disp: , Rfl:    triamcinolone cream (KENALOG) 0.1 %, Apply twice daily to affected area up to 2 weeks as needed for rash. Avoid applying to face, groin, and axilla. Use as directed. (Patient not taking: Reported on 08/17/2022), Disp: 80 g, Rfl: 2  Physical exam:  Vitals:   08/17/22 1041  BP: 122/67  Pulse: 66  Resp: 16  Temp: 98.3 F (36.8 C)  TempSrc: Oral  Weight: 164 lb (74.4 kg)  Height: '5\' 5"'  (1.651 m)   Physical Exam Constitutional:       General: She is not in acute distress. Cardiovascular:     Rate and Rhythm: Normal rate and regular rhythm.     Heart sounds: Normal heart sounds.  Pulmonary:     Effort: Pulmonary effort is normal.     Breath sounds: Normal breath sounds.  Abdominal:     General: Bowel sounds are normal.     Palpations: Abdomen is soft.  Skin:    General: Skin is warm and dry.  Neurological:     Mental Status: She is alert and oriented to person, place, and time.          No data to display            Latest Ref Rng & Units 08/17/2022   10:01 AM  CBC  WBC 4.0 - 10.5 K/uL 11.4   Hemoglobin 12.0 - 15.0 g/dL 13.8   Hematocrit 36.0 - 46.0 % 40.3   Platelets 150 - 400 K/uL 84      Assessment and plan- Patient is a 71 y.o. female here for routine follow-up of ITP  Patient has baseline leukocyte/neutrophilia with a white cell count that is typically around 11 and has remained unchanged.  She has developed thrombocytopenia since August 2022.  At that time her platelet count was 120.  Over the last 10 months her platelet counts have been fluctuating between 60s to 90s.  It is 84 today.  In the absence of other cytopenias patient does not require a bone marrow biopsy at this time and I will continue to monitor this conservatively with repeat CBC with differential in 4 months and 8 months and I will see her back in 8 months.  She will let us know if she has any new symptoms of bleeding or bruising.  Treatment for ITP would be indicated only if platelet counts are less than 30   Visit Diagnosis 1. Thrombocytopenia ([place])      Dr. Randa Evens, MD, MPH [facility] at [facility] [phone] 08/17/2022 11:22 AM

## 2022-09-14 DIAGNOSIS — T7840XD Allergy, unspecified, subsequent encounter: Secondary | ICD-10-CM | POA: Diagnosis not present

## 2022-09-14 DIAGNOSIS — J4530 Mild persistent asthma, uncomplicated: Secondary | ICD-10-CM | POA: Diagnosis not present

## 2022-09-14 DIAGNOSIS — Z0000 Encounter for general adult medical examination without abnormal findings: Secondary | ICD-10-CM | POA: Diagnosis not present

## 2022-09-14 DIAGNOSIS — M810 Age-related osteoporosis without current pathological fracture: Secondary | ICD-10-CM | POA: Diagnosis not present

## 2022-09-14 DIAGNOSIS — R7309 Other abnormal glucose: Secondary | ICD-10-CM | POA: Diagnosis not present

## 2022-09-14 DIAGNOSIS — F32A Depression, unspecified: Secondary | ICD-10-CM | POA: Diagnosis not present

## 2022-09-14 DIAGNOSIS — E785 Hyperlipidemia, unspecified: Secondary | ICD-10-CM | POA: Diagnosis not present

## 2022-09-14 DIAGNOSIS — K219 Gastro-esophageal reflux disease without esophagitis: Secondary | ICD-10-CM | POA: Diagnosis not present

## 2022-09-14 DIAGNOSIS — G43009 Migraine without aura, not intractable, without status migrainosus: Secondary | ICD-10-CM | POA: Diagnosis not present

## 2022-10-05 DIAGNOSIS — M8588 Other specified disorders of bone density and structure, other site: Secondary | ICD-10-CM | POA: Diagnosis not present

## 2022-10-09 DIAGNOSIS — H47393 Other disorders of optic disc, bilateral: Secondary | ICD-10-CM | POA: Diagnosis not present

## 2022-10-13 DIAGNOSIS — Z1231 Encounter for screening mammogram for malignant neoplasm of breast: Secondary | ICD-10-CM | POA: Diagnosis not present

## 2022-11-09 DIAGNOSIS — J449 Chronic obstructive pulmonary disease, unspecified: Secondary | ICD-10-CM | POA: Diagnosis not present

## 2022-11-09 DIAGNOSIS — J301 Allergic rhinitis due to pollen: Secondary | ICD-10-CM | POA: Diagnosis not present

## 2022-11-09 DIAGNOSIS — J208 Acute bronchitis due to other specified organisms: Secondary | ICD-10-CM | POA: Diagnosis not present

## 2022-11-09 DIAGNOSIS — K219 Gastro-esophageal reflux disease without esophagitis: Secondary | ICD-10-CM | POA: Diagnosis not present

## 2022-12-17 ENCOUNTER — Inpatient Hospital Stay: Payer: PPO | Attending: Oncology

## 2022-12-17 DIAGNOSIS — D696 Thrombocytopenia, unspecified: Secondary | ICD-10-CM | POA: Insufficient documentation

## 2022-12-17 LAB — CBC WITH DIFFERENTIAL/PLATELET
Abs Immature Granulocytes: 0.07 10*3/uL (ref 0.00–0.07)
Basophils Absolute: 0.1 10*3/uL (ref 0.0–0.1)
Basophils Relative: 1 %
Eosinophils Absolute: 0.2 10*3/uL (ref 0.0–0.5)
Eosinophils Relative: 1 %
HCT: 41.9 % (ref 36.0–46.0)
Hemoglobin: 13.4 g/dL (ref 12.0–15.0)
Immature Granulocytes: 1 %
Lymphocytes Relative: 28 %
Lymphs Abs: 3.8 10*3/uL (ref 0.7–4.0)
MCH: 30.6 pg (ref 26.0–34.0)
MCHC: 32 g/dL (ref 30.0–36.0)
MCV: 95.7 fL (ref 80.0–100.0)
Monocytes Absolute: 0.8 10*3/uL (ref 0.1–1.0)
Monocytes Relative: 6 %
Neutro Abs: 8.7 10*3/uL — ABNORMAL HIGH (ref 1.7–7.7)
Neutrophils Relative %: 63 %
Platelets: 125 10*3/uL — ABNORMAL LOW (ref 150–400)
RBC: 4.38 MIL/uL (ref 3.87–5.11)
RDW: 12.7 % (ref 11.5–15.5)
WBC: 13.6 10*3/uL — ABNORMAL HIGH (ref 4.0–10.5)
nRBC: 0 % (ref 0.0–0.2)

## 2023-03-11 DIAGNOSIS — J449 Chronic obstructive pulmonary disease, unspecified: Secondary | ICD-10-CM | POA: Diagnosis not present

## 2023-03-11 DIAGNOSIS — K2270 Barrett's esophagus without dysplasia: Secondary | ICD-10-CM | POA: Diagnosis not present

## 2023-03-11 DIAGNOSIS — J208 Acute bronchitis due to other specified organisms: Secondary | ICD-10-CM | POA: Diagnosis not present

## 2023-03-11 DIAGNOSIS — J301 Allergic rhinitis due to pollen: Secondary | ICD-10-CM | POA: Diagnosis not present

## 2023-03-16 DIAGNOSIS — T7840XD Allergy, unspecified, subsequent encounter: Secondary | ICD-10-CM | POA: Diagnosis not present

## 2023-03-16 DIAGNOSIS — F32A Depression, unspecified: Secondary | ICD-10-CM | POA: Diagnosis not present

## 2023-03-16 DIAGNOSIS — J4530 Mild persistent asthma, uncomplicated: Secondary | ICD-10-CM | POA: Diagnosis not present

## 2023-03-16 DIAGNOSIS — K219 Gastro-esophageal reflux disease without esophagitis: Secondary | ICD-10-CM | POA: Diagnosis not present

## 2023-03-16 DIAGNOSIS — Z0000 Encounter for general adult medical examination without abnormal findings: Secondary | ICD-10-CM | POA: Diagnosis not present

## 2023-03-16 DIAGNOSIS — G43009 Migraine without aura, not intractable, without status migrainosus: Secondary | ICD-10-CM | POA: Diagnosis not present

## 2023-03-16 DIAGNOSIS — E785 Hyperlipidemia, unspecified: Secondary | ICD-10-CM | POA: Diagnosis not present

## 2023-03-16 DIAGNOSIS — D696 Thrombocytopenia, unspecified: Secondary | ICD-10-CM | POA: Diagnosis not present

## 2023-03-16 DIAGNOSIS — R7309 Other abnormal glucose: Secondary | ICD-10-CM | POA: Diagnosis not present

## 2023-03-23 DIAGNOSIS — J4530 Mild persistent asthma, uncomplicated: Secondary | ICD-10-CM | POA: Diagnosis not present

## 2023-03-23 DIAGNOSIS — R7309 Other abnormal glucose: Secondary | ICD-10-CM | POA: Diagnosis not present

## 2023-03-23 DIAGNOSIS — E785 Hyperlipidemia, unspecified: Secondary | ICD-10-CM | POA: Diagnosis not present

## 2023-03-23 DIAGNOSIS — K219 Gastro-esophageal reflux disease without esophagitis: Secondary | ICD-10-CM | POA: Diagnosis not present

## 2023-04-20 ENCOUNTER — Inpatient Hospital Stay: Payer: PPO | Attending: Oncology | Admitting: Oncology

## 2023-04-20 ENCOUNTER — Inpatient Hospital Stay: Payer: PPO

## 2023-04-20 ENCOUNTER — Encounter: Payer: Self-pay | Admitting: Oncology

## 2023-04-20 VITALS — BP 147/67 | HR 78 | Temp 96.7°F | Resp 18 | Wt 160.9 lb

## 2023-04-20 DIAGNOSIS — D696 Thrombocytopenia, unspecified: Secondary | ICD-10-CM | POA: Diagnosis not present

## 2023-04-20 DIAGNOSIS — D72819 Decreased white blood cell count, unspecified: Secondary | ICD-10-CM | POA: Insufficient documentation

## 2023-04-20 DIAGNOSIS — D729 Disorder of white blood cells, unspecified: Secondary | ICD-10-CM

## 2023-04-20 LAB — CBC WITH DIFFERENTIAL/PLATELET
Abs Immature Granulocytes: 0.1 10*3/uL — ABNORMAL HIGH (ref 0.00–0.07)
Basophils Absolute: 0.1 10*3/uL (ref 0.0–0.1)
Basophils Relative: 1 %
Eosinophils Absolute: 0.2 10*3/uL (ref 0.0–0.5)
Eosinophils Relative: 1 %
HCT: 43.1 % (ref 36.0–46.0)
Hemoglobin: 14.5 g/dL (ref 12.0–15.0)
Immature Granulocytes: 1 %
Lymphocytes Relative: 22 %
Lymphs Abs: 3.6 10*3/uL (ref 0.7–4.0)
MCH: 30.9 pg (ref 26.0–34.0)
MCHC: 33.6 g/dL (ref 30.0–36.0)
MCV: 91.7 fL (ref 80.0–100.0)
Monocytes Absolute: 0.8 10*3/uL (ref 0.1–1.0)
Monocytes Relative: 5 %
Neutro Abs: 11.2 10*3/uL — ABNORMAL HIGH (ref 1.7–7.7)
Neutrophils Relative %: 70 %
Platelets: 143 10*3/uL — ABNORMAL LOW (ref 150–400)
RBC: 4.7 MIL/uL (ref 3.87–5.11)
RDW: 12.1 % (ref 11.5–15.5)
WBC: 16 10*3/uL — ABNORMAL HIGH (ref 4.0–10.5)
nRBC: 0 % (ref 0.0–0.2)

## 2023-04-20 NOTE — Progress Notes (Signed)
Hematology/Oncology Consult note [facility]  [phone] [phone]  Patient Care Team: Dorothey Baseman, MD as PCP - General (Family Medicine)   Name of the patient: Carly Hoffman  [id number]  [birth year]   Date of visit: 04/20/23  Diagnosis- 1.  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  Flow cytometry and BCR ABL testing did not reveal any abnormality.  She was then asked to follow-up with her PCP.  Since then her white count has remained stably elevated between 11-18 and it waxes and wanes.  Differential is mainly showed neutrophilia and lymphocytosis with no clear rising trend.  On her recent blood work from 07/01/2021 patient noted to have a white count of 13.1, H&H of 14/42.9 and a platelet count of 100.  Prior to that her platelet count was normal at 185 in July 2021.  She has therefore been referred to [place] for thrombocytopenia.  Patient denies any over-the-counter herbal supplements.  No recent changes in her medications.  She denies any bleeding or bruising.   Interval history-patient is doing well presently and denies any specific complaints at this time  ECOG PS- 1 Pain scale- 0   Review of systems- Review of Systems  Constitutional:  Negative for chills, fever, malaise/fatigue and weight loss.  HENT:  Negative for congestion, ear discharge and nosebleeds.   Eyes:  Negative for blurred vision.  Respiratory:  Negative for cough, hemoptysis, sputum production, shortness of breath and wheezing.   Cardiovascular:  Negative for chest pain, palpitations, orthopnea and claudication.  Gastrointestinal:  Negative for abdominal pain, blood in stool, constipation, diarrhea,  heartburn, melena, nausea and vomiting.  Genitourinary:  Negative for dysuria, flank pain, frequency, hematuria and urgency.  Musculoskeletal:  Negative for back pain, joint pain and myalgias.  Skin:  Negative for rash.  Neurological:  Negative for dizziness, tingling, focal weakness, seizures, weakness and headaches.  Endo/Heme/Allergies:  Does not bruise/bleed easily.  Psychiatric/Behavioral:  Negative for depression and suicidal ideas. The patient does not have insomnia.       Allergies  Allergen Reactions   Topiramate Nausea Only    Shaky, imbalnced   Penicillin G Rash     Past Medical History:  Diagnosis Date   Asthma    Barrett esophagus    Barrett's esophagus    C. difficile diarrhea 03/2016   hospitalized in [place] x 4 d w c diff after colonoscopy    COPD (chronic obstructive pulmonary disease) (HCC)    Depression    Diarrhea    Diverticulosis    GERD (gastroesophageal reflux disease)    H/O seasonal allergies    H/O sinusitis    unspecified   Headache    migranes   History of hiatal hernia    Hyperlipidemia    Osteoporosis, postmenopausal    Shortness of breath dyspnea    Thrombocytopenia (HCC) 07/15/2021   Possible   Thrombocytopenia (HCC)    Varicose vein      Past Surgical History:  Procedure Laterality Date   APPENDECTOMY     COLONOSCOPY WITH ESOPHAGOGASTRODUODENOSCOPY (EGD)     COLONOSCOPY WITH PROPOFOL N/A 03/30/2016   Procedure: COLONOSCOPY WITH PROPOFOL;  Surgeon: Christena Deem, MD;  Location: [facility] ENDOSCOPY;  Service: Endoscopy;  Laterality: N/A;  COLONOSCOPY WITH PROPOFOL N/A 09/25/2016   Procedure: COLONOSCOPY WITH PROPOFOL;  Surgeon: Scot Jun, MD;  Location: [facility] ENDOSCOPY;  Service: Endoscopy;  Laterality: N/A;   COLONOSCOPY WITH PROPOFOL N/A 08/01/2021   Procedure: COLONOSCOPY WITH PROPOFOL;  Surgeon: Regis Bill, MD;  Location: [facility] ENDOSCOPY;  Service: Endoscopy;  Laterality: N/A;   DILATION AND CURETTAGE OF UTERUS      ESOPHAGOGASTRODUODENOSCOPY (EGD) WITH PROPOFOL N/A 03/30/2016   Procedure: ESOPHAGOGASTRODUODENOSCOPY (EGD) WITH PROPOFOL;  Surgeon: Christena Deem, MD;  Location: [facility] ENDOSCOPY;  Service: Endoscopy;  Laterality: N/A;   ESOPHAGOGASTRODUODENOSCOPY (EGD) WITH PROPOFOL N/A 10/11/2019   Procedure: ESOPHAGOGASTRODUODENOSCOPY (EGD) WITH PROPOFOL;  Surgeon: Earline Mayotte, MD;  Location: [facility] ENDOSCOPY;  Service: Endoscopy;  Laterality: N/A;   ESOPHAGOGASTRODUODENOSCOPY (EGD) WITH PROPOFOL N/A 08/01/2021   Procedure: ESOPHAGOGASTRODUODENOSCOPY (EGD) WITH PROPOFOL;  Surgeon: Regis Bill, MD;  Location: [facility] ENDOSCOPY;  Service: Endoscopy;  Laterality: N/A;   FECAL TRANSPLANT N/A 09/25/2016   Procedure: FECAL TRANSPLANT;  Surgeon: Scot Jun, MD;  Location: [facility] ENDOSCOPY;  Service: Endoscopy;  Laterality: N/A;   TONSILLECTOMY     TUBAL LIGATION      Social History   Socioeconomic History   Marital status: Divorced    Spouse name: Not on file   Number of children: Not on file   Years of education: Not on file   Highest education level: Not on file  Occupational History   Not on file  Tobacco Use   Smoking status: Never   Smokeless tobacco: Never  Vaping Use   Vaping Use: Never used  Substance and Sexual Activity   Alcohol use: No   Drug use: No   Sexual activity: Yes  Other Topics Concern   Not on file  Social History Narrative   Not on file   Social Determinants of Health   Financial Resource Strain: Not on file  Food Insecurity: Not on file  Transportation Needs: Not on file  Physical Activity: Not on file  Stress: Not on file  Social Connections: Not on file  Intimate Partner Violence: Not on file    Family History  Problem Relation Age of Onset   Emphysema Mother    Lung cancer Father    Emphysema Father    Atrial fibrillation Father    Atrial fibrillation Brother      Current Outpatient Medications:    albuterol (PROVENTIL HFA;VENTOLIN HFA) 108  (90 Base) MCG/ACT inhaler, Inhale 2 puffs into the lungs every 6 (six) hours as needed for wheezing or shortness of breath., Disp: , Rfl:    ALPRAZolam (XANAX) 0.25 MG tablet, TAKE 1/2 TO 1 TABLET BY MOUTH ONCE DAILY AS NEEDED, Disp: , Rfl:    budesonide-formoterol (SYMBICORT) 160-4.5 MCG/ACT inhaler, Inhale 1 puff into the lungs 2 (two) times daily., Disp: , Rfl:    cyclobenzaprine (FLEXERIL) 5 MG tablet, Take 5 mg by mouth 3 (three) times daily as needed for muscle spasms., Disp: , Rfl:    fexofenadine (ALLEGRA) 180 MG tablet, Take 180 mg by mouth daily., Disp: , Rfl:    FLUoxetine (PROZAC) 40 MG capsule, Take 1 tablet by mouth daily., Disp: , Rfl:    HYDROcodone-acetaminophen (NORCO) 7.5-325 MG tablet, 1 tablet every 6 (six) hours as needed (Do not use every day). , Disp: , Rfl:    montelukast (SINGULAIR) 10 MG tablet, Take 10 mg by mouth at bedtime., Disp: , Rfl:    Multiple Vitamin (MULTIVITAMIN) tablet, Take 1 tablet by mouth daily., Disp: ,  Rfl:    omeprazole (PRILOSEC) 20 MG capsule, Take 20 mg by mouth 2 (two) times daily before a meal., Disp: , Rfl:    ondansetron (ZOFRAN-ODT) 4 MG disintegrating tablet, Take 4 mg by mouth every 8 (eight) hours as needed for nausea or vomiting., Disp: , Rfl:    rizatriptan (MAXALT) 10 MG tablet, Take 10 mg by mouth as needed for migraine. May repeat in 2 hours if needed, Disp: , Rfl:    simvastatin (ZOCOR) 20 MG tablet, Take 20 mg by mouth daily., Disp: , Rfl:    traZODone (DESYREL) 100 MG tablet, Take 100 mg by mouth at bedtime., Disp: , Rfl:    triamcinolone cream (KENALOG) 0.1 %, Apply twice daily to affected area up to 2 weeks as needed for rash. Avoid applying to face, groin, and axilla. Use as directed., Disp: 80 g, Rfl: 2  Physical exam:  Vitals:   04/20/23 1037 04/20/23 1040  BP: (!) 148/68 (!) 147/67  Pulse: 75 78  Resp: 18   Temp: (!) 96.7 F (35.9 C)   TempSrc: Tympanic   SpO2: 97%   Weight: 160 lb 14.4 oz (73 kg)    Physical  Exam Cardiovascular:     Rate and Rhythm: Normal rate and regular rhythm.     Heart sounds: Normal heart sounds.  Pulmonary:     Effort: Pulmonary effort is normal.     Breath sounds: Normal breath sounds.  Abdominal:     General: Bowel sounds are normal.     Palpations: Abdomen is soft.  Skin:    General: Skin is warm and dry.  Neurological:     Mental Status: She is alert and oriented to person, place, and time.          No data to display            Latest Ref Rng & Units 04/20/2023   10:05 AM  CBC  WBC 4.0 - 10.5 K/uL 16.0   Hemoglobin 12.0 - 15.0 g/dL 16.1   Hematocrit 09.6 - 46.0 % 43.1   Platelets 150 - 400 K/uL 143     No images are attached to the encounter.  No results found.   Assessment and plan- Patient is a 72 y.o. female here for follow-up of following issues:  Thrombocytopenia: This has been waxing and waning but in the last 6 months or is been in a for trending her platelet counts.  Her platelet count was down to 64 in December 2022 and since then there has been a gradual uptick in the platelet counts presently at 143. she does not require any treatment for this.  Leukocytosis/neutrophilia: vital count today 16.  In the past test actuated but when 11 to-15..  I will continue to monitor her neutrophil count every 4 months with back in 8 months   Visit Diagnosis 1. Thrombocytopenia (HCC)   2. Neutrophilia      Dr. Owens Shark, MD, MPH [facility] at [facility] [phone] 04/20/2023 1:01 PM

## 2023-04-26 ENCOUNTER — Other Ambulatory Visit: Payer: Self-pay

## 2023-04-26 ENCOUNTER — Emergency Department
Admission: EM | Admit: 2023-04-26 | Discharge: 2023-04-26 | Disposition: A | Discharge status: Home / Self Care | Payer: PPO | Attending: Emergency Medicine | Admitting: Emergency Medicine

## 2023-04-26 ENCOUNTER — Encounter: Payer: Self-pay | Admitting: Intensive Care

## 2023-04-26 ENCOUNTER — Emergency Department: Payer: PPO

## 2023-04-26 DIAGNOSIS — R109 Unspecified abdominal pain: Secondary | ICD-10-CM | POA: Diagnosis not present

## 2023-04-26 DIAGNOSIS — A0472 Enterocolitis due to Clostridium difficile, not specified as recurrent: Secondary | ICD-10-CM | POA: Diagnosis not present

## 2023-04-26 DIAGNOSIS — J45909 Unspecified asthma, uncomplicated: Secondary | ICD-10-CM | POA: Insufficient documentation

## 2023-04-26 DIAGNOSIS — I700 Atherosclerosis of aorta: Secondary | ICD-10-CM | POA: Diagnosis not present

## 2023-04-26 LAB — URINALYSIS, ROUTINE W REFLEX MICROSCOPIC
Bilirubin Urine: NEGATIVE
Glucose, UA: NEGATIVE mg/dL
Hgb urine dipstick: NEGATIVE
Ketones, ur: 5 mg/dL — AB
Leukocytes,Ua: NEGATIVE
Nitrite: NEGATIVE
Protein, ur: NEGATIVE mg/dL
Specific Gravity, Urine: 1.018 (ref 1.005–1.030)
pH: 5 (ref 5.0–8.0)

## 2023-04-26 LAB — COMPREHENSIVE METABOLIC PANEL
ALT: 14 U/L (ref 0–44)
AST: 17 U/L (ref 15–41)
Albumin: 4.4 g/dL (ref 3.5–5.0)
Alkaline Phosphatase: 70 U/L (ref 38–126)
Anion gap: 8 (ref 5–15)
BUN: 10 mg/dL (ref 8–23)
CO2: 28 mmol/L (ref 22–32)
Calcium: 9.2 mg/dL (ref 8.9–10.3)
Chloride: 101 mmol/L (ref 98–111)
Creatinine, Ser: 0.93 mg/dL (ref 0.44–1.00)
GFR, Estimated: >60 mL/min (ref >60)
Glucose, Bld: 129 mg/dL — ABNORMAL HIGH (ref 70–99)
Potassium: 3.5 mmol/L (ref 3.5–5.1)
Sodium: 137 mmol/L (ref 135–145)
Total Bilirubin: 0.5 mg/dL (ref 0.3–1.2)
Total Protein: 7.6 g/dL (ref 6.5–8.1)

## 2023-04-26 LAB — CBC
HCT: 43.2 % (ref 36.0–46.0)
Hemoglobin: 14.3 g/dL (ref 12.0–15.0)
MCH: 30.6 pg (ref 26.0–34.0)
MCHC: 33.1 g/dL (ref 30.0–36.0)
MCV: 92.3 fL (ref 80.0–100.0)
Platelets: 154 10*3/uL (ref 150–400)
RBC: 4.68 MIL/uL (ref 3.87–5.11)
RDW: 12.3 % (ref 11.5–15.5)
WBC: 17.4 10*3/uL — ABNORMAL HIGH (ref 4.0–10.5)
nRBC: 0 % (ref 0.0–0.2)

## 2023-04-26 LAB — GASTROINTESTINAL PANEL BY PCR, STOOL (REPLACES STOOL CULTURE)

## 2023-04-26 LAB — C DIFFICILE QUICK SCREEN W PCR REFLEX
C Diff antigen: POSITIVE — AB
C Diff interpretation: DETECTED
C Diff toxin: POSITIVE — AB

## 2023-04-26 LAB — LIPASE, BLOOD: Lipase: 23 U/L (ref 11–51)

## 2023-04-26 MED ORDER — FIDAXOMICIN 200 MG PO TABS: 200.0000 mg | ORAL_TABLET | Freq: Two times a day (BID) | ORAL | 0 refills | Status: AC

## 2023-04-26 MED ORDER — IOHEXOL 300 MG/ML  SOLN
100.0000 mL | Freq: Once | INTRAMUSCULAR | Status: AC | PRN
  Administered 2023-04-26: 100 mL via INTRAVENOUS

## 2023-04-26 MED ORDER — MORPHINE SULFATE (PF) 4 MG/ML IV SOLN
4.0000 mg | Freq: Once | INTRAVENOUS | Status: AC
  Administered 2023-04-26: 4 mg via INTRAVENOUS
  Filled 2023-04-26: qty 1

## 2023-04-26 MED ORDER — ONDANSETRON HCL 4 MG/2ML IJ SOLN
4.0000 mg | Freq: Once | INTRAMUSCULAR | Status: AC
  Administered 2023-04-26: 4 mg via INTRAVENOUS
  Filled 2023-04-26: qty 2

## 2023-04-26 MED ORDER — LACTATED RINGERS IV BOLUS
1000.0000 mL | Freq: Once | INTRAVENOUS | Status: AC
  Administered 2023-04-26: 1000 mL via INTRAVENOUS

## 2023-04-26 NOTE — ED Provider Notes (Signed)
[facility] Provider Note    Event Date/Time   First MD Initiated Contact with Patient 04/26/23 1054     (approximate)   History   Chief Complaint Abdominal Pain   HPI  Carly Hoffman is a 72 y.o. female with past medical history of hyperlipidemia, GERD, asthma, and C. difficile colitis who presents to the ED complaining of abdominal pain.  Patient reports that she has had about 1 week of crampy pain in the bilateral lower quadrants of her abdomen associated with nausea and diarrhea.  She additionally thought that she could have a viral infection as her husband had similar symptoms, but symptoms have persisted this week and her stool has become like mucus.  She reports small streaks of blood in her stool and describes symptoms as similar to when she has dealt with C. difficile in the past.  She does report being treated with a course of azithromycin about a month and a half ago for a respiratory infection.  She denies any fevers or vomiting.     Physical Exam   Triage Vital Signs: ED Triage Vitals  Enc Vitals Group     BP 04/26/23 1016 (!) 161/70     Pulse Rate 04/26/23 1016 89     Resp 04/26/23 1016 18     Temp 04/26/23 1016 98 F (36.7 C)     Temp Source 04/26/23 1016 Oral     SpO2 04/26/23 1016 95 %     Weight 04/26/23 1019 160 lb (72.6 kg)     Height 04/26/23 1019 5\' 5"  (1.651 m)     Head Circumference --      Peak Flow --      Pain Score 04/26/23 1019 8     Pain Loc --      Pain Edu? --      Excl. in GC? --     Most recent vital signs: Vitals:   04/26/23 1016 04/26/23 1130  BP: (!) 161/70 137/78  Pulse: 89 73  Resp: 18 18  Temp: 98 F (36.7 C)   SpO2: 95% 96%    Constitutional: Alert and oriented. Eyes: Conjunctivae are normal. Head: Atraumatic. Nose: No congestion/rhinnorhea. Mouth/Throat: Mucous membranes are moist.  Cardiovascular: Normal rate, regular rhythm. Grossly normal heart sounds.  2+ radial pulses  bilaterally. Respiratory: Normal respiratory effort.  No retractions. Lungs CTAB. Gastrointestinal: Soft and diffusely tender to palpation, greatest in the left lower quadrant. No distention. Musculoskeletal: No lower extremity tenderness nor edema.  Neurologic:  Normal speech and language. No gross focal neurologic deficits are appreciated.    ED Results / Procedures / Treatments   Labs (all labs ordered are listed, but only abnormal results are displayed) Labs Reviewed  C DIFFICILE QUICK SCREEN W PCR REFLEX   - Abnormal; Notable for the following components:      Result Value   C Diff antigen POSITIVE (*)    C Diff toxin POSITIVE (*)    All other components within normal limits  COMPREHENSIVE METABOLIC PANEL - Abnormal; Notable for the following components:   Glucose, Bld 129 (*)    All other components within normal limits  CBC - Abnormal; Notable for the following components:   WBC 17.4 (*)    All other components within normal limits  URINALYSIS, ROUTINE W REFLEX MICROSCOPIC - Abnormal; Notable for the following components:   Color, Urine YELLOW (*)    APPearance CLEAR (*)    Ketones, ur 5 (*)  All other components within normal limits  GASTROINTESTINAL PANEL BY PCR, STOOL (REPLACES STOOL CULTURE)  LIPASE, BLOOD   RADIOLOGY CT abdomen/pelvis reviewed and interpreted by me with inflammatory changes around the colon, no dilated bowel loops or focal fluid collections noted.  PROCEDURES:  Critical Care performed: No  Procedures   MEDICATIONS ORDERED IN ED: Medications  morphine (PF) 4 MG/ML injection 4 mg (4 mg Intravenous Given 04/26/23 1128)  ondansetron (ZOFRAN) injection 4 mg (4 mg Intravenous Given 04/26/23 1128)  lactated ringers bolus 1,000 mL (1,000 mLs Intravenous New Bag/Given 04/26/23 1131)  iohexol (OMNIPAQUE) 300 MG/ML solution 100 mL (100 mLs Intravenous Contrast Given 04/26/23 1156)     IMPRESSION / MDM / ASSESSMENT AND PLAN / ED COURSE  I reviewed the  triage vital signs and the nursing notes.                              72 y.o. female with past medical history of hyperlipidemia, GERD, asthma, and C. difficile who presents to the ED complaining of crampy abdominal pain for 1 week associated with mucus like stool and nausea.  Patient's presentation is most consistent with acute presentation with potential threat to life or bodily function.  Differential diagnosis includes, but is not limited to, diverticulitis, appendicitis, colitis, C. difficile, dehydration, electrolyte abnormality, AKI, UTI.  Patient nontoxic-appearing and in no acute distress, vital signs are unremarkable.  Her abdomen is soft but she has diffuse tenderness on exam, greatest in the left lower quadrant.  Labs show leukocytosis increased from patient's prior baseline, platelet count does appear improved with no significant anemia.  She has no significant electrolyte abnormality, AKI, or LFT abnormality.  Will treat symptomatically with IV morphine and Zofran, hydrate with IV fluids and further assess with CT imaging.  Stool studies are also pending at this time.  CT imaging shows colitis, stool studies are positive for C. difficile with toxin and suspect patient has C. difficile colitis.  Urinalysis with no signs of infection and patient feeling better following symptomatic treatment, tolerating oral intake without difficulty.  She would be appropriate for round of outpatient management and we will prescribe fidaxomicin, have her follow-up with her GI provider.  She was counseled to return to the ED for new or worsening symptoms, patient agrees with plan.      FINAL CLINICAL IMPRESSION(S) / ED DIAGNOSES   Final diagnoses:  C. difficile colitis     Rx / DC Orders   ED Discharge Orders          Ordered    fidaxomicin (DIFICID) 200 MG TABS tablet  2 times daily        04/26/23 1241             Note:  This document was prepared using Dragon voice recognition  software and may include unintentional dictation errors.   Chesley Noon, MD 04/26/23 1242

## 2023-04-26 NOTE — ED Notes (Signed)
Pt verbalizes understanding of discharge instructions. Opportunity for questioning and answers were provided. Pt discharged from ED to home with daughter.    

## 2023-04-26 NOTE — ED Triage Notes (Signed)
Patient c/o abdominal pain with N/V/D for a few days. C/o mucous and red streaks in stool  Reports history c-diff and colitis.

## 2023-04-30 ENCOUNTER — Telehealth: Payer: Self-pay

## 2023-04-30 ENCOUNTER — Telehealth: Payer: Self-pay | Admitting: *Deleted

## 2023-04-30 NOTE — Telephone Encounter (Addendum)
Transition Care Management Follow-up Telephone Call Date of discharge and from where: 04/26/2023 [facility] How have you been since you were released from the hospital? Patient is still not feeling much better. Any questions or concerns? No  Items Reviewed: Did the pt receive and understand the discharge instructions provided? Yes  Medications obtained and verified?  Yes. Emailed Medicare Extrahelp information to assist with medicine cost. Other?  Patient stated she received exceptional care Any new allergies since your discharge? No  Dietary orders reviewed? Yes Do you have support at home? Yes   Follow up appointments reviewed:  PCP Hospital f/u appt confirmed? Yes  Scheduled to see Dorothey Baseman, MD on 04/28/2023 @ [facility]. Specialist Hospital f/u appt confirmed? No  Scheduled to see  on  @ . Are transportation arrangements needed? No  If their condition worsens, is the pt aware to call PCP or go to the Emergency Dept.? Yes Was the patient provided with contact information for the PCP's office or ED? Yes Was to pt encouraged to call back with questions or concerns? Yes  Ferrin Liebig [facility]  [facility] Population Health Community Resource Care Guide   [email or web address]  ?? [phone]   Website: [email or web address]  [email or web address]

## 2023-04-30 NOTE — Telephone Encounter (Signed)
Patient called reporting that she had to go to Er and she has been diagnosed with C diff and has been started on Dificid for it. She wanted Dr Smith Robert to  know this since she was just in to see her and had labs drawn

## 2023-05-20 ENCOUNTER — Encounter: Payer: Self-pay | Admitting: Radiology

## 2023-05-20 ENCOUNTER — Emergency Department
Admission: EM | Admit: 2023-05-20 | Discharge: 2023-05-20 | Disposition: A | Discharge status: Home / Self Care | Payer: PPO | Attending: Emergency Medicine | Admitting: Emergency Medicine

## 2023-05-20 ENCOUNTER — Emergency Department: Payer: PPO

## 2023-05-20 ENCOUNTER — Other Ambulatory Visit: Payer: Self-pay

## 2023-05-20 DIAGNOSIS — R000 Tachycardia, unspecified: Secondary | ICD-10-CM | POA: Insufficient documentation

## 2023-05-20 DIAGNOSIS — R197 Diarrhea, unspecified: Secondary | ICD-10-CM | POA: Diagnosis not present

## 2023-05-20 DIAGNOSIS — Z8619 Personal history of other infectious and parasitic diseases: Secondary | ICD-10-CM | POA: Diagnosis not present

## 2023-05-20 DIAGNOSIS — A0472 Enterocolitis due to Clostridium difficile, not specified as recurrent: Secondary | ICD-10-CM | POA: Diagnosis not present

## 2023-05-20 DIAGNOSIS — R1084 Generalized abdominal pain: Secondary | ICD-10-CM | POA: Diagnosis not present

## 2023-05-20 DIAGNOSIS — E860 Dehydration: Secondary | ICD-10-CM | POA: Diagnosis not present

## 2023-05-20 LAB — COMPREHENSIVE METABOLIC PANEL
ALT: 17 U/L (ref 0–44)
AST: 22 U/L (ref 15–41)
Albumin: 4.5 g/dL (ref 3.5–5.0)
Alkaline Phosphatase: 65 U/L (ref 38–126)
Anion gap: 13 (ref 5–15)
BUN: 12 mg/dL (ref 8–23)
CO2: 23 mmol/L (ref 22–32)
Calcium: 9.4 mg/dL (ref 8.9–10.3)
Chloride: 99 mmol/L (ref 98–111)
Creatinine, Ser: 0.82 mg/dL (ref 0.44–1.00)
GFR, Estimated: >60 mL/min (ref >60)
Glucose, Bld: 138 mg/dL — ABNORMAL HIGH (ref 70–99)
Potassium: 3.7 mmol/L (ref 3.5–5.1)
Sodium: 135 mmol/L (ref 135–145)
Total Bilirubin: 0.6 mg/dL (ref 0.3–1.2)
Total Protein: 7.8 g/dL (ref 6.5–8.1)

## 2023-05-20 LAB — C DIFFICILE QUICK SCREEN W PCR REFLEX
C Diff antigen: POSITIVE — AB
C Diff interpretation: DETECTED
C Diff toxin: POSITIVE — AB

## 2023-05-20 LAB — CBC
HCT: 46.2 % — ABNORMAL HIGH (ref 36.0–46.0)
Hemoglobin: 14.9 g/dL (ref 12.0–15.0)
MCH: 29.7 pg (ref 26.0–34.0)
MCHC: 32.3 g/dL (ref 30.0–36.0)
MCV: 92 fL (ref 80.0–100.0)
Platelets: 151 10*3/uL (ref 150–400)
RBC: 5.02 MIL/uL (ref 3.87–5.11)
RDW: 12.7 % (ref 11.5–15.5)
WBC: 17.3 10*3/uL — ABNORMAL HIGH (ref 4.0–10.5)
nRBC: 0 % (ref 0.0–0.2)

## 2023-05-20 LAB — LIPASE, BLOOD: Lipase: 25 U/L (ref 11–51)

## 2023-05-20 MED ORDER — SODIUM CHLORIDE 0.9 % IV BOLUS
1000.0000 mL | Freq: Once | INTRAVENOUS | Status: AC
  Administered 2023-05-20: 1000 mL via INTRAVENOUS

## 2023-05-20 MED ORDER — KETOROLAC TROMETHAMINE 15 MG/ML IJ SOLN
15.0000 mg | Freq: Once | INTRAMUSCULAR | Status: AC
  Administered 2023-05-20: 15 mg via INTRAVENOUS
  Filled 2023-05-20: qty 1

## 2023-05-20 MED ORDER — ONDANSETRON HCL 4 MG/2ML IJ SOLN
4.0000 mg | Freq: Once | INTRAMUSCULAR | Status: AC
  Administered 2023-05-20: 4 mg via INTRAVENOUS
  Filled 2023-05-20: qty 2

## 2023-05-20 MED ORDER — ONDANSETRON HCL 4 MG PO TABS: 4.0000 mg | ORAL_TABLET | Freq: Every day | ORAL | 1 refills | Status: AC | PRN

## 2023-05-20 MED ORDER — IOHEXOL 300 MG/ML  SOLN
100.0000 mL | Freq: Once | INTRAMUSCULAR | Status: AC | PRN
  Administered 2023-05-20: 100 mL via INTRAVENOUS

## 2023-05-20 MED ORDER — VANCOMYCIN HCL 125 MG PO CAPS: 125.0000 mg | ORAL_CAPSULE | Freq: Four times a day (QID) | ORAL | 0 refills | Status: AC

## 2023-05-20 NOTE — ED Provider Notes (Addendum)
[facility] Provider Note    Event Date/Time   First MD Initiated Contact with Patient 05/20/23 1424     (approximate)   History   Diarrhea   HPI  Carly Hoffman is a 72 y.o. female   Past medical history of recurrent C. difficile colitis, GERD, hyperlipidemia, here with ongoing symptoms related to her C. difficile diagnosis earlier this month has been taking antibiotics as prescribed.  Continues to have watery diarrhea with occasional blood.  Subjective fevers.  No urinary symptoms.  No nausea or vomiting.  Crampy abdominal pain.  She had another C. difficile colitis infection greater than 10 years ago that was refractory to oral antibiotic treatment and ultimately needed a fecal transplant to resolve  Independent Historian contributed to assessment above: Her family member is at bedside to corroborate information given above  External Medical Documents Reviewed: Emergency department visit dated at the beginning of June 2024 diagnosed with C. difficile colitis and started on abx      Physical Exam   Triage Vital Signs: ED Triage Vitals [05/20/23 1242]  Enc Vitals Group     BP (!) 175/75     Pulse Rate (!) 106     Resp 18     Temp 99.5 F (37.5 C)     Temp Source Oral     SpO2 99 %     Weight 160 lb 0.9 oz (72.6 kg)     Height 5\' 5"  (1.651 m)     Head Circumference      Peak Flow      Pain Score 10     Pain Loc      Pain Edu?      Excl. in GC?     Most recent vital signs: Vitals:   05/20/23 1242  BP: (!) 175/75  Pulse: (!) 106  Resp: 18  Temp: 99.5 F (37.5 C)  SpO2: 99%    General: Awake, no distress.  CV:  Good peripheral perfusion.  Resp:  Normal effort.  Abd:  No distention.  Other:  Soft nontender abdomen no distention.  Appears dehydrated with dry mucous membranes poor skin turgor and tachycardia.  99.5 temperature.   ED Results / Procedures / Treatments   Labs (all labs ordered are listed, but only abnormal  results are displayed) Labs Reviewed  C DIFFICILE QUICK SCREEN W PCR REFLEX   - Abnormal; Notable for the following components:      Result Value   C Diff antigen POSITIVE (*)    C Diff toxin POSITIVE (*)    All other components within normal limits  COMPREHENSIVE METABOLIC PANEL - Abnormal; Notable for the following components:   Glucose, Bld 138 (*)    All other components within normal limits  CBC - Abnormal; Notable for the following components:   WBC 17.3 (*)    HCT 46.2 (*)    All other components within normal limits  GASTROINTESTINAL PANEL BY PCR, STOOL (REPLACES STOOL CULTURE)  LIPASE, BLOOD  URINALYSIS, ROUTINE W REFLEX MICROSCOPIC     I ordered and reviewed the above labs they are notable for white blood cell count remains elevated consistent with prior testing at 17 H&H stable.   RADIOLOGY I independently reviewed and interpreted CT of the abdomen pelvis see no obvious obstructive patterns or fluid collection   PROCEDURES:  Critical Care performed: No  Procedures   MEDICATIONS ORDERED IN ED: Medications  sodium chloride 0.9 % bolus 1,000 mL (1,000 mLs  Intravenous New Bag/Given 05/20/23 1522)  ondansetron (ZOFRAN) injection 4 mg (4 mg Intravenous Given 05/20/23 1553)  ketorolac (TORADOL) 15 MG/ML injection 15 mg (15 mg Intravenous Given 05/20/23 1553)  iohexol (OMNIPAQUE) 300 MG/ML solution 100 mL (100 mLs Intravenous Contrast Given 05/20/23 1533)    IMPRESSION / MDM / ASSESSMENT AND PLAN / ED COURSE  I reviewed the triage vital signs and the nursing notes.                                Patient's presentation is most consistent with acute presentation with potential threat to life or bodily function.  Differential diagnosis includes, but is not limited to, C. difficile colitis, gastroenteritis, toxic megacolon, intra-abdominal infection, appendicitis, dehydration or electrolyte disturbance acute blood loss anemia from GI bleeding   The patient is on the  cardiac monitor to evaluate for evidence of arrhythmia and/or significant heart rate changes.  MDM: Ongoing C. difficile colitis in this patient now appears slightly dehydrated will give IV fluids for rehydration, antiemetic, check stool studies including C. difficile, CT of the abdomen pelvis to rule out surgical abdominal pathologies like appendicitis or abscess.  Doubt urinary tract infection given no urinary symptoms.  C. difficile positive.  If workup above is negative for other emergent pathologies, will switch to oral vancomycin have her follow-up with PMD, may eventually need GI consultation if continues to be refractory to oral antibiotics.       FINAL CLINICAL IMPRESSION(S) / ED DIAGNOSES   Final diagnoses:  Diarrhea of presumed infectious origin  Dehydration  C. difficile colitis     Rx / DC Orders   ED Discharge Orders          Ordered    vancomycin (VANCOCIN) 125 MG capsule  4 times daily        05/20/23 1620    ondansetron (ZOFRAN) 4 MG tablet  Daily PRN        05/20/23 1621             Note:  This document was prepared using Dragon voice recognition software and may include unintentional dictation errors.    Pilar Jarvis, MD 05/20/23 1610    Pilar Jarvis, MD 05/20/23 [phone]

## 2023-05-20 NOTE — Discharge Instructions (Signed)
Take vancomycin as prescribed 4 times daily for the full course.  Follow-up with your primary doctor this week for recheck and talk to them about a referral to gastroenterology if your symptoms continue despite this new medication.  Drink plenty of fluids to avoid dehydration.  Take Zofran for nausea as needed.  Find Pedialyte or similar electrolyte hydration formulas at your local pharmacy and drink throughout the day.

## 2023-05-20 NOTE — ED Notes (Signed)
Urine and stool sample sent to lab

## 2023-05-20 NOTE — ED Triage Notes (Signed)
Pt here with diarrhea and fever. Pt was given Dificid abx for her c diff which did not work. Pt still having diarrhea and nausea. Pt ambulatory to triage. Pt states she has not been able to sleep and running a fever.

## 2023-06-09 DIAGNOSIS — K219 Gastro-esophageal reflux disease without esophagitis: Secondary | ICD-10-CM | POA: Diagnosis not present

## 2023-06-09 DIAGNOSIS — D72823 Leukemoid reaction: Secondary | ICD-10-CM | POA: Diagnosis not present

## 2023-06-09 DIAGNOSIS — A0472 Enterocolitis due to Clostridium difficile, not specified as recurrent: Secondary | ICD-10-CM | POA: Diagnosis not present

## 2023-07-15 DIAGNOSIS — J449 Chronic obstructive pulmonary disease, unspecified: Secondary | ICD-10-CM | POA: Diagnosis not present

## 2023-07-15 DIAGNOSIS — J301 Allergic rhinitis due to pollen: Secondary | ICD-10-CM | POA: Diagnosis not present

## 2023-07-15 DIAGNOSIS — K2270 Barrett's esophagus without dysplasia: Secondary | ICD-10-CM | POA: Diagnosis not present

## 2023-08-12 ENCOUNTER — Ambulatory Visit: Payer: PPO | Admitting: Gastroenterology

## 2023-08-20 ENCOUNTER — Inpatient Hospital Stay: Payer: PPO

## 2023-08-20 ENCOUNTER — Other Ambulatory Visit: Payer: PPO

## 2023-09-15 DIAGNOSIS — R053 Chronic cough: Secondary | ICD-10-CM | POA: Diagnosis not present

## 2023-09-15 DIAGNOSIS — R918 Other nonspecific abnormal finding of lung field: Secondary | ICD-10-CM | POA: Diagnosis not present

## 2023-09-15 DIAGNOSIS — J4521 Mild intermittent asthma with (acute) exacerbation: Secondary | ICD-10-CM | POA: Diagnosis not present

## 2023-09-15 DIAGNOSIS — R059 Cough, unspecified: Secondary | ICD-10-CM | POA: Diagnosis not present

## 2023-09-15 DIAGNOSIS — J439 Emphysema, unspecified: Secondary | ICD-10-CM | POA: Diagnosis not present

## 2023-09-21 DIAGNOSIS — J4489 Other specified chronic obstructive pulmonary disease: Secondary | ICD-10-CM | POA: Diagnosis not present

## 2023-09-21 DIAGNOSIS — R0602 Shortness of breath: Secondary | ICD-10-CM | POA: Diagnosis not present

## 2023-09-21 DIAGNOSIS — R7981 Abnormal blood-gas level: Secondary | ICD-10-CM | POA: Diagnosis not present

## 2023-09-21 DIAGNOSIS — J449 Chronic obstructive pulmonary disease, unspecified: Secondary | ICD-10-CM | POA: Diagnosis not present

## 2023-09-28 DIAGNOSIS — J4530 Mild persistent asthma, uncomplicated: Secondary | ICD-10-CM | POA: Diagnosis not present

## 2023-09-28 DIAGNOSIS — E785 Hyperlipidemia, unspecified: Secondary | ICD-10-CM | POA: Diagnosis not present

## 2023-09-28 DIAGNOSIS — D696 Thrombocytopenia, unspecified: Secondary | ICD-10-CM | POA: Diagnosis not present

## 2023-09-28 DIAGNOSIS — R7309 Other abnormal glucose: Secondary | ICD-10-CM | POA: Diagnosis not present

## 2023-09-28 DIAGNOSIS — K219 Gastro-esophageal reflux disease without esophagitis: Secondary | ICD-10-CM | POA: Diagnosis not present

## 2023-09-28 DIAGNOSIS — F32A Depression, unspecified: Secondary | ICD-10-CM | POA: Diagnosis not present

## 2023-09-28 DIAGNOSIS — M810 Age-related osteoporosis without current pathological fracture: Secondary | ICD-10-CM | POA: Diagnosis not present

## 2023-10-12 DIAGNOSIS — H2513 Age-related nuclear cataract, bilateral: Secondary | ICD-10-CM | POA: Diagnosis not present

## 2023-10-12 DIAGNOSIS — H40003 Preglaucoma, unspecified, bilateral: Secondary | ICD-10-CM | POA: Diagnosis not present

## 2023-10-12 DIAGNOSIS — H47393 Other disorders of optic disc, bilateral: Secondary | ICD-10-CM | POA: Diagnosis not present

## 2023-10-22 DIAGNOSIS — J449 Chronic obstructive pulmonary disease, unspecified: Secondary | ICD-10-CM | POA: Diagnosis not present

## 2023-11-09 DIAGNOSIS — J4530 Mild persistent asthma, uncomplicated: Secondary | ICD-10-CM | POA: Diagnosis not present

## 2023-11-21 DIAGNOSIS — J449 Chronic obstructive pulmonary disease, unspecified: Secondary | ICD-10-CM | POA: Diagnosis not present

## 2023-12-21 ENCOUNTER — Ambulatory Visit: Payer: PPO | Admitting: Oncology

## 2023-12-21 ENCOUNTER — Encounter: Payer: Self-pay | Admitting: Oncology

## 2023-12-21 ENCOUNTER — Inpatient Hospital Stay: Payer: PPO | Admitting: Oncology

## 2023-12-21 ENCOUNTER — Other Ambulatory Visit: Payer: PPO

## 2023-12-21 ENCOUNTER — Inpatient Hospital Stay: Payer: PPO | Attending: Oncology

## 2023-12-21 VITALS — BP 150/55 | HR 71 | Temp 98.3°F | Resp 20 | Wt 158.0 lb

## 2023-12-21 DIAGNOSIS — D696 Thrombocytopenia, unspecified: Secondary | ICD-10-CM | POA: Insufficient documentation

## 2023-12-21 DIAGNOSIS — D72829 Elevated white blood cell count, unspecified: Secondary | ICD-10-CM | POA: Insufficient documentation

## 2023-12-21 DIAGNOSIS — D72828 Other elevated white blood cell count: Secondary | ICD-10-CM

## 2023-12-21 LAB — CBC WITH DIFFERENTIAL/PLATELET
Abs Immature Granulocytes: 0.05 10*3/uL (ref 0.00–0.07)
Basophils Absolute: 0.1 10*3/uL (ref 0.0–0.1)
Basophils Relative: 1 %
Eosinophils Absolute: 0.3 10*3/uL (ref 0.0–0.5)
Eosinophils Relative: 2 %
HCT: 42.4 % (ref 36.0–46.0)
Hemoglobin: 14.2 g/dL (ref 12.0–15.0)
Immature Granulocytes: 0 %
Lymphocytes Relative: 24 %
Lymphs Abs: 2.8 10*3/uL (ref 0.7–4.0)
MCH: 30.3 pg (ref 26.0–34.0)
MCHC: 33.5 g/dL (ref 30.0–36.0)
MCV: 90.4 fL (ref 80.0–100.0)
Monocytes Absolute: 0.5 10*3/uL (ref 0.1–1.0)
Monocytes Relative: 4 %
Neutro Abs: 8 10*3/uL — ABNORMAL HIGH (ref 1.7–7.7)
Neutrophils Relative %: 69 %
Platelets: 100 10*3/uL — ABNORMAL LOW (ref 150–400)
RBC: 4.69 MIL/uL (ref 3.87–5.11)
RDW: 12.6 % (ref 11.5–15.5)
WBC: 11.7 10*3/uL — ABNORMAL HIGH (ref 4.0–10.5)
nRBC: 0 % (ref 0.0–0.2)

## 2023-12-21 NOTE — Progress Notes (Signed)
Hematology/Oncology Consult note [facility]  [phone] [phone]  Patient Care Team: Dorothey Baseman, MD as PCP - General (Family Medicine) Creig Hines, MD as Consulting Physician (Oncology)   Name of the patient: Carly Hoffman  [id number]  Oct 07, 1951   Date of visit: 12/21/23  Diagnosis-  1.  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  Flow cytometry and BCR ABL testing did not reveal any abnormality.  She was then asked to follow-up with her PCP.  Since then her white count has remained stably elevated between 11-18 and it waxes and wanes.  Differential is mainly showed neutrophilia and lymphocytosis with no clear rising trend.  On her recent blood work from 07/01/2021 patient noted to have a white count of 13.1, H&H of 14/42.9 and a platelet count of 100.  Prior to that her platelet count was normal at 185 in July 2021.  She has therefore been referred to [place] for thrombocytopenia.  Patient denies any over-the-counter herbal supplements.  No recent changes in her medications.  She denies any bleeding or bruising.   Interval history- patient reports being stressed after her 32 year old daughter suddenly passed away.  In terms of her own health otherwise she is doing well and denies any specific complaints at this time  ECOG PS- 1 Pain scale- 0   Review of systems- Review of Systems  Constitutional:  Negative for chills, fever, malaise/fatigue and weight loss.  HENT:  Negative for congestion, ear discharge and nosebleeds.   Eyes:  Negative for blurred vision.  Respiratory:  Negative for cough, hemoptysis, sputum production, shortness of breath and wheezing.    Cardiovascular:  Negative for chest pain, palpitations, orthopnea and claudication.  Gastrointestinal:  Negative for abdominal pain, blood in stool, constipation, diarrhea, heartburn, melena, nausea and vomiting.  Genitourinary:  Negative for dysuria, flank pain, frequency, hematuria and urgency.  Musculoskeletal:  Negative for back pain, joint pain and myalgias.  Skin:  Negative for rash.  Neurological:  Negative for dizziness, tingling, focal weakness, seizures, weakness and headaches.  Endo/Heme/Allergies:  Does not bruise/bleed easily.  Psychiatric/Behavioral:  Negative for depression and suicidal ideas. The patient does not have insomnia.       Allergies  Allergen Reactions   Topiramate Nausea Only    Shaky, imbalnced   Penicillin G Rash     Past Medical History:  Diagnosis Date   Asthma    Barrett esophagus    Barrett's esophagus    C. difficile diarrhea 03/2016   hospitalized in [place] x 4 d w c diff after colonoscopy    COPD (chronic obstructive pulmonary disease) (HCC)    Depression    Diarrhea    Diverticulosis    GERD (gastroesophageal reflux disease)    H/O seasonal allergies    H/O sinusitis    unspecified   Headache    migranes   History of hiatal hernia    Hyperlipidemia    Osteoporosis, postmenopausal    Shortness of breath dyspnea    Thrombocytopenia (HCC) 07/15/2021   Possible   Thrombocytopenia (HCC)    Varicose vein      Past Surgical History:  Procedure Laterality Date   APPENDECTOMY     COLONOSCOPY WITH ESOPHAGOGASTRODUODENOSCOPY (EGD)  COLONOSCOPY WITH PROPOFOL N/A 03/30/2016   Procedure: COLONOSCOPY WITH PROPOFOL;  Surgeon: Christena Deem, MD;  Location: [facility] ENDOSCOPY;  Service: Endoscopy;  Laterality: N/A;   COLONOSCOPY WITH PROPOFOL N/A 09/25/2016   Procedure: COLONOSCOPY WITH PROPOFOL;  Surgeon: Scot Jun, MD;  Location: [facility] ENDOSCOPY;  Service: Endoscopy;  Laterality: N/A;   COLONOSCOPY WITH PROPOFOL N/A 08/01/2021    Procedure: COLONOSCOPY WITH PROPOFOL;  Surgeon: Regis Bill, MD;  Location: [facility] ENDOSCOPY;  Service: Endoscopy;  Laterality: N/A;   DILATION AND CURETTAGE OF UTERUS     ESOPHAGOGASTRODUODENOSCOPY (EGD) WITH PROPOFOL N/A 03/30/2016   Procedure: ESOPHAGOGASTRODUODENOSCOPY (EGD) WITH PROPOFOL;  Surgeon: Christena Deem, MD;  Location: [facility] ENDOSCOPY;  Service: Endoscopy;  Laterality: N/A;   ESOPHAGOGASTRODUODENOSCOPY (EGD) WITH PROPOFOL N/A 10/11/2019   Procedure: ESOPHAGOGASTRODUODENOSCOPY (EGD) WITH PROPOFOL;  Surgeon: Earline Mayotte, MD;  Location: [facility] ENDOSCOPY;  Service: Endoscopy;  Laterality: N/A;   ESOPHAGOGASTRODUODENOSCOPY (EGD) WITH PROPOFOL N/A 08/01/2021   Procedure: ESOPHAGOGASTRODUODENOSCOPY (EGD) WITH PROPOFOL;  Surgeon: Regis Bill, MD;  Location: [facility] ENDOSCOPY;  Service: Endoscopy;  Laterality: N/A;   FECAL TRANSPLANT N/A 09/25/2016   Procedure: FECAL TRANSPLANT;  Surgeon: Scot Jun, MD;  Location: [facility] ENDOSCOPY;  Service: Endoscopy;  Laterality: N/A;   TONSILLECTOMY     TUBAL LIGATION      Social History   Socioeconomic History   Marital status: Divorced    Spouse name: Not on file   Number of children: Not on file   Years of education: Not on file   Highest education level: Not on file  Occupational History   Not on file  Tobacco Use   Smoking status: Never   Smokeless tobacco: Never  Vaping Use   Vaping status: Never Used  Substance and Sexual Activity   Alcohol use: No   Drug use: No   Sexual activity: Yes  Other Topics Concern   Not on file  Social History Narrative   Not on file   Social Drivers of Health   Financial Resource Strain: Low Risk  (11/09/2023)   Received from [facility]   Overall Financial Resource Strain (CARDIA)    Difficulty of Paying Living Expenses: Not hard at all  Food Insecurity: No Food Insecurity (11/09/2023)   Received from [facility]   Hunger Vital Sign     Worried About Running Out of Food in the Last Year: Never true    Ran Out of Food in the Last Year: Never true  Transportation Needs: No Transportation Needs (11/09/2023)   Received from [facility] - Transportation    In the past 12 months, has lack of transportation kept you from medical appointments or from getting medications?: No    Lack of Transportation (Non-Medical): No  Physical Activity: Not on file  Stress: Not on file  Social Connections: Not on file  Intimate Partner Violence: Not on file    Family History  Problem Relation Age of Onset   Emphysema Mother    Lung cancer Father    Emphysema Father    Atrial fibrillation Father    Atrial fibrillation Brother      Current Outpatient Medications:    albuterol (PROVENTIL HFA;VENTOLIN HFA) 108 (90 Base) MCG/ACT inhaler, Inhale 2 puffs into the lungs every 6 (six) hours as needed for wheezing or shortness of breath., Disp: , Rfl:    ALPRAZolam (XANAX) 0.25 MG tablet, TAKE 1/2 TO 1 TABLET BY MOUTH ONCE  DAILY AS NEEDED, Disp: , Rfl:    cyclobenzaprine (FLEXERIL) 5 MG tablet, Take 5 mg by mouth 3 (three) times daily as needed for muscle spasms., Disp: , Rfl:    fexofenadine (ALLEGRA) 180 MG tablet, Take 180 mg by mouth daily., Disp: , Rfl:    FLUoxetine (PROZAC) 40 MG capsule, Take 1 tablet by mouth daily., Disp: , Rfl:    HYDROcodone-acetaminophen (NORCO) 7.5-325 MG tablet, 1 tablet every 6 (six) hours as needed (Do not use every day). , Disp: , Rfl:    montelukast (SINGULAIR) 10 MG tablet, Take 10 mg by mouth at bedtime., Disp: , Rfl:    Multiple Vitamin (MULTIVITAMIN) tablet, Take 1 tablet by mouth daily., Disp: , Rfl:    omeprazole (PRILOSEC) 20 MG capsule, Take 20 mg by mouth 2 (two) times daily before a meal., Disp: , Rfl:    ondansetron (ZOFRAN) 4 MG tablet, Take 1 tablet (4 mg total) by mouth daily as needed for nausea or vomiting., Disp: 30 tablet, Rfl: 1   ondansetron (ZOFRAN-ODT) 4 MG  disintegrating tablet, Take 4 mg by mouth every 8 (eight) hours as needed., Disp: , Rfl:    rizatriptan (MAXALT) 10 MG tablet, Take 10 mg by mouth as needed for migraine. May repeat in 2 hours if needed, Disp: , Rfl:    simvastatin (ZOCOR) 20 MG tablet, Take 20 mg by mouth daily., Disp: , Rfl:    traZODone (DESYREL) 100 MG tablet, Take 100 mg by mouth at bedtime., Disp: , Rfl:    triamcinolone cream (KENALOG) 0.1 %, Apply twice daily to affected area up to 2 weeks as needed for rash. Avoid applying to face, groin, and axilla. Use as directed., Disp: 80 g, Rfl: 2   WIXELA INHUB 250-50 MCG/ACT AEPB, SMARTSIG:1 Puff(s) By Mouth Every 12 Hours, Disp: , Rfl:    budesonide-formoterol (SYMBICORT) 160-4.5 MCG/ACT inhaler, Inhale 1 puff into the lungs 2 (two) times daily., Disp: , Rfl:   Physical exam:  Vitals:   12/21/23 1024  BP: (!) 150/55  Pulse: 71  Resp: 20  Temp: 98.3 F (36.8 C)  SpO2: 100%  Weight: 158 lb (71.7 kg)   Physical Exam Cardiovascular:     Rate and Rhythm: Normal rate and regular rhythm.     Heart sounds: Normal heart sounds.  Pulmonary:     Effort: Pulmonary effort is normal.     Breath sounds: Normal breath sounds.  Skin:    General: Skin is warm and dry.  Neurological:     Mental Status: She is alert and oriented to person, place, and time.         Latest Ref Rng & Units 05/20/2023   12:44 PM  CMP  Glucose 70 - 99 mg/dL 284   BUN 8 - 23 mg/dL 12   Creatinine 1.32 - 1.00 mg/dL 4.40   Sodium 102 - 725 mmol/L 135   Potassium 3.5 - 5.1 mmol/L 3.7   Chloride 98 - 111 mmol/L 99   CO2 22 - 32 mmol/L 23   Calcium 8.9 - 10.3 mg/dL 9.4   Total Protein 6.5 - 8.1 g/dL 7.8   Total Bilirubin 0.3 - 1.2 mg/dL 0.6   Alkaline Phos 38 - 126 U/L 65   AST 15 - 41 U/L 22   ALT 0 - 44 U/L 17       Latest Ref Rng & Units 12/21/2023   10:05 AM  CBC  WBC 4.0 - 10.5 K/uL 11.7   Hemoglobin 12.0 -  15.0 g/dL 16.1   Hematocrit 09.6 - 46.0 % 42.4   Platelets 150 - 400 K/uL 100       Assessment and plan- Patient is a 73 y.o. female who is here for routine follow-up of thrombocytopenia and neutrophilia  Patient has had stable thrombocytopenia with platelet count that fluctuates between 80s to 150 over the last 3 years.  She also has chronic leukocytosis/neutrophilia with a white count that fluctuates between 11-17 without a clear rising trend.  This does not require bone marrow biopsy at this time.  CBC with differential in 6 months in 1 year and I will see her back in 1 year   Visit Diagnosis 1. Thrombocytopenia (HCC)      Dr. Owens Shark, MD, MPH [facility] [phone] 12/21/2023 1:08 PM

## 2023-12-22 DIAGNOSIS — J449 Chronic obstructive pulmonary disease, unspecified: Secondary | ICD-10-CM | POA: Diagnosis not present

## 2023-12-23 DIAGNOSIS — D696 Thrombocytopenia, unspecified: Secondary | ICD-10-CM | POA: Diagnosis not present

## 2023-12-23 DIAGNOSIS — K921 Melena: Secondary | ICD-10-CM | POA: Diagnosis not present

## 2023-12-23 DIAGNOSIS — A0472 Enterocolitis due to Clostridium difficile, not specified as recurrent: Secondary | ICD-10-CM | POA: Diagnosis not present

## 2024-01-21 DIAGNOSIS — J449 Chronic obstructive pulmonary disease, unspecified: Secondary | ICD-10-CM | POA: Diagnosis not present

## 2024-02-19 DIAGNOSIS — J449 Chronic obstructive pulmonary disease, unspecified: Secondary | ICD-10-CM | POA: Diagnosis not present

## 2024-02-23 ENCOUNTER — Telehealth: Payer: Self-pay | Admitting: Gastroenterology

## 2024-02-23 NOTE — Telephone Encounter (Signed)
 Good afternoon Dr. Tomasa Rand,   I received a call from this patient regarding a referral to our practice. Patient was last seen with [facility] clinic. Patient has also had a EGD and Colonoscopy with them back in September of 2022. Patient stated that she has not like her care with [facility] clinic due to them denying her that right to see her provider regarding her C- Diff. Patient referral was sent over for hx of diverticulosis and c. Diff. You are able to see and review patient EGD and colonoscopy on EPIC. Would you please advise on scheduling.    Thank you.

## 2024-02-24 DIAGNOSIS — Z1231 Encounter for screening mammogram for malignant neoplasm of breast: Secondary | ICD-10-CM | POA: Diagnosis not present

## 2024-02-28 ENCOUNTER — Encounter: Payer: Self-pay | Admitting: Gastroenterology

## 2024-03-21 DIAGNOSIS — J449 Chronic obstructive pulmonary disease, unspecified: Secondary | ICD-10-CM | POA: Diagnosis not present

## 2024-03-21 DIAGNOSIS — Z9981 Dependence on supplemental oxygen: Secondary | ICD-10-CM | POA: Diagnosis not present

## 2024-03-21 DIAGNOSIS — J4489 Other specified chronic obstructive pulmonary disease: Secondary | ICD-10-CM | POA: Diagnosis not present

## 2024-03-21 DIAGNOSIS — K219 Gastro-esophageal reflux disease without esophagitis: Secondary | ICD-10-CM | POA: Diagnosis not present

## 2024-03-28 DIAGNOSIS — F32A Depression, unspecified: Secondary | ICD-10-CM | POA: Diagnosis not present

## 2024-03-28 DIAGNOSIS — R7309 Other abnormal glucose: Secondary | ICD-10-CM | POA: Diagnosis not present

## 2024-03-28 DIAGNOSIS — Z8619 Personal history of other infectious and parasitic diseases: Secondary | ICD-10-CM | POA: Diagnosis not present

## 2024-03-28 DIAGNOSIS — D696 Thrombocytopenia, unspecified: Secondary | ICD-10-CM | POA: Diagnosis not present

## 2024-03-28 DIAGNOSIS — E785 Hyperlipidemia, unspecified: Secondary | ICD-10-CM | POA: Diagnosis not present

## 2024-03-28 DIAGNOSIS — J4530 Mild persistent asthma, uncomplicated: Secondary | ICD-10-CM | POA: Diagnosis not present

## 2024-03-31 DIAGNOSIS — J4530 Mild persistent asthma, uncomplicated: Secondary | ICD-10-CM | POA: Diagnosis not present

## 2024-03-31 DIAGNOSIS — E785 Hyperlipidemia, unspecified: Secondary | ICD-10-CM | POA: Diagnosis not present

## 2024-03-31 DIAGNOSIS — R7309 Other abnormal glucose: Secondary | ICD-10-CM | POA: Diagnosis not present

## 2024-04-06 DIAGNOSIS — R7309 Other abnormal glucose: Secondary | ICD-10-CM | POA: Diagnosis not present

## 2024-04-20 DIAGNOSIS — J449 Chronic obstructive pulmonary disease, unspecified: Secondary | ICD-10-CM | POA: Diagnosis not present

## 2024-05-01 ENCOUNTER — Ambulatory Visit: Admitting: Gastroenterology

## 2024-05-01 ENCOUNTER — Encounter: Payer: Self-pay | Admitting: Gastroenterology

## 2024-05-01 VITALS — BP 122/78 | HR 105 | Ht 64.0 in | Wt 153.0 lb

## 2024-05-01 DIAGNOSIS — K2270 Barrett's esophagus without dysplasia: Secondary | ICD-10-CM

## 2024-05-01 DIAGNOSIS — R1310 Dysphagia, unspecified: Secondary | ICD-10-CM

## 2024-05-01 DIAGNOSIS — K31A0 Gastric intestinal metaplasia, unspecified: Secondary | ICD-10-CM | POA: Diagnosis not present

## 2024-05-01 DIAGNOSIS — K219 Gastro-esophageal reflux disease without esophagitis: Secondary | ICD-10-CM

## 2024-05-01 DIAGNOSIS — K2100 Gastro-esophageal reflux disease with esophagitis, without bleeding: Secondary | ICD-10-CM

## 2024-05-01 DIAGNOSIS — Z8619 Personal history of other infectious and parasitic diseases: Secondary | ICD-10-CM

## 2024-05-01 DIAGNOSIS — R1319 Other dysphagia: Secondary | ICD-10-CM

## 2024-05-01 DIAGNOSIS — Z860100 Personal history of colon polyps, unspecified: Secondary | ICD-10-CM

## 2024-05-01 NOTE — Patient Instructions (Addendum)
 Continue omeprazole daily.   You have been scheduled for an endoscopy. Please follow written instructions given to you at your visit today.  If you use inhalers (even only as needed), please bring them with you on the day of your procedure.  If you take any of the following medications, they will need to be adjusted prior to your procedure:   DO NOT TAKE 7 DAYS PRIOR TO TEST- Trulicity (dulaglutide) Ozempic, Wegovy (semaglutide) Mounjaro (tirzepatide) Bydureon Bcise (exanatide extended release)  DO NOT TAKE 1 DAY PRIOR TO YOUR TEST Rybelsus (semaglutide) Adlyxin (lixisenatide) Victoza (liraglutide) Byetta (exanatide) ______________________________________________________________  If your blood pressure at your visit was 140/90 or greater, please contact your primary care physician to follow up on this.  _______________________________________________________  If you are age 45 or older, your body mass index should be between 23-30. Your Body mass index is 26.26 kg/m. If this is out of the aforementioned range listed, please consider follow up with your Primary Care Provider.  If you are age 32 or younger, your body mass index should be between 19-25. Your Body mass index is 26.26 kg/m. If this is out of the aformentioned range listed, please consider follow up with your Primary Care Provider.   ________________________________________________________  The [facility] providers would like to encourage you to use MYCHART to communicate with providers for non-urgent requests or questions.  Due to long hold times on the telephone, sending your provider a message by [facility] may be a faster and more efficient way to get a response.  Please allow 48 business hours for a response.  Please remember that this is for non-urgent requests.  _______________________________________________________

## 2024-05-01 NOTE — Progress Notes (Signed)
 Discussed the use of AI scribe software for clinical note transcription with the patient, who gave verbal consent to proceed.  HPI : Carly Hoffman is a 73 year old female with a history asthma, of colon polyps, GERD/Barrett's esophagus, gastric intestinal metaplasia and recurrent Clostridioides difficile infections who presents for gastroenterology consultation.  She has no concerns or complaints today.  She has experienced recurrent Clostridioides difficile infections, with the most recent episodes in May/June of the previous year.  She thinks she was given an antibiotic for a lung infection shortly before the C diff infection.  During the most recent episode she was unable to receive timely care from her gastroenterologist, leading to significant out-of-pocket expenses due to emergency room visits. Initial treatment with fidaxomicin  was ineffective, and so was given another course of PO vancomcyin, which eventually resolved the infection. She is currently taking an over-the-counter probiotic, Flora, as suggested by her daughter-in-law. No current bowel habit issues, with regular and formed stools.  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  She has a history of colon polyps, with the last colonoscopy in 2022 revealing three small polyps (2 Tas and 1 SSP). She has a history of a 16 mm hepatic flexure TA removed in 2017. She experiences significant difficulty with colonoscopy preparation, which causes nausea and diarrhea with incontinence.  She has a history of gastroesophageal reflux disease (GERD) and Barrett's esophagus, though recent endoscopy indicated the absence of Barrett's esophagus. She reports long-standing swallowing difficulties, particularly with textured foods, and manages this by chewing thoroughly and drinking water. She takes omeprazole once daily (not twice daily) for reflux symptoms.  No prior history of esophageal strictures noted on EGDs and no  prior dilations.  She has been on as needed home oxygen  for the past two years, primarily using it in the mornings when her levels are low. She does not use it daily.       EGD Sept 2022 Small hiatal hernia Normal appearing esophagus, no evidence of Barrett's Patchy moderate antral erythema  Colonoscopy Sept 2022 3 mm polyp transverse colon 1 mm polyp transverse colon 4 mm polyp ascending colon Otherwise normal colonoscopy  DIAGNOSIS:  A. STOMACH; COLD BIOPSY:  - CHRONIC AND FOCALLY ACTIVE GASTRITIS WITH INTESTINAL METAPLASIA.  - NEGATIVE FOR HELICOBACTER PYLORI BY IMMUNOHISTOCHEMISTRY.  - NEGATIVE FOR DYSPLASIA AND MALIGNANCY.   B. COLON POLYP X2, TRANSVERSE; COLD SNARE AND COLD BIOPSY:  - TUBULAR ADENOMA, TWO FRAGMENTS.  - NEGATIVE FOR HIGH GRADE DYSPLASIA AND MALIGNANCY.   C. COLON POLYP, ASCENDING; COLD SNARE:  - SESSILE SERRATED POLYP.  - NEGATIVE FOR DYSPLASIA AND MALIGNANCY    EGD Nov 2020 [place] Grade A esophagitis, biopised Mild prepyloric inflammation 5 mm polyp on second portion of duodenum  DIAGNOSIS:  A. DUODENUM, SECOND PORTION; COLD BIOPSY:  - DUODENAL MUCOSA WITH NODULAR BRUNNER GLAND PROLIFERATION, MOST  CONSISTENT WITH BRUNNER GLAND HAMARTOMA.  - NEGATIVE FOR DYSPLASIA AND MALIGNANCY.   B.  ESOPHAGUS, DISTAL AT 39 CM; COLD BIOPSY:  - SQUAMOCOLUMNAR JUNCTIONAL MUCOSA WITH REFLUX GASTROESOPHAGITIS.  - GASTRIC CARDIAC TYPE MUCOSA WITH RARE GOBLET CELLS, NON-SPECIFIC (SEE  COMMENT).  - NEGATIVE FOR DYSPLASIA AND MALIGNANCY.   C.  ESOPHAGUS, DISTAL 37 CM; COLD BIOPSY:  - FRAGMENTS OF UNREMARKABLE SQUAMOUS MUCOSA.  - GLANDULAR EPITHELIUM IS NOT PRESENT FOR IDENTIFICATION.  - NEGATIVE FOR DYSPLASIA AND MALIGNANCY     Colonoscopy May 2017  16 mm polyp, hepatic flexure 4 mm polyp HF 3 mm polyp  transverse colon 2 polyps, sigmoid colon (1-2 mm) 2 mm polyp rectum Diverticulosis in left colon  DIAGNOSIS: A. STOMACH, ANTRUM AND BODY; COLD BIOPSY: -  ANTRAL MUCOSA WITH FOCAL EROSION. - OXYNTIC MUCOSA WITH MINIMAL CHRONIC GASTRITIS. - NEGATIVE FOR H. PYLORI, DYSPLASIA, AND MALIGNANCY.  B. ESOPHAGUS, 36-37 CM; COLD BIOPSY: - BARRETT'S ESOPHAGUS. - NEGATIVE FOR DYSPLASIA AND MALIGNANCY.  C. COLON POLYPS, HEPATIC FLEXURE; COLD SNARE AND BIOPSY: - TUBULAR ADENOMA, MULTIPLE FRAGMENTS. - NEGATIVE FOR HIGH-GRADE DYSPLASIA AND MALIGNANCY.  D. COLON POLYP, HEPATIC FLEXURE; COLD BIOPSY: - TUBULAR ADENOMA. - NEGATIVE FOR HIGH-GRADE DYSPLASIA AND MALIGNANCY.  E. COLON POLYP, TRANSVERSE; COLD BIOPSY: - TUBULAR ADENOMA. - NEGATIVE FOR HIGH-GRADE DYSPLASIA AND MALIGNANCY.  F. COLON POLYPS 2, SIGMOID; COLD BIOPSY: - HYPERPLASTIC POLYPS (2). - NEGATIVE FOR DYSPLASIA AND MALIGNANCY.  G. DISTAL RECTUM POLYP; COLD BIOPSY: - HYPERPLASTIC POLYP. - NEGATIVE FOR DYSPLASIA AND MALIGNANCY     EGD 2015   Part A: ANTRAL/BODY OF STOMACH COLD BIOPSY: - ANTRAL MUCOSA WITH MILD SUPERFICIAL VASCULAR CONGESTION AND REACTIVE FOVEOLAR HYPERPLASIA. - OXYNTIC MUCOSA WITH MILD CHRONIC GASTRITIS AND INCREASE IN PARIETAL CELL SIZE WITH APICAL PROTRUSIONS, SEE COMMENT. - NEGATIVE FOR H.PYLORI, DYSPLASIA AND MALIGNANCY. . Part B: POSTERIOR BODY OF STOMACH EROSIONS COLD BIOPSY: - EROSIVE GASTRITIS. - NEGATIVE FOR H.PYLORI, DYSPLASIA AND MALIGNANCY. . Part C: ESOPHAGUS AT 38-39CM COLD BIOPSY: - BARRETT ESOPHAGUS. - REFLUX ESOPHAGITIS. - NEGATIVE FOR DYSPLASIA AND MALIGNANCY      EGD 2013  Diagnosis: Part A: ANTRAL AND BODY OF STOMACH COLD BIOPSY: - ANTRAL MUCOSA WITH MILD SUPERFICIAL VASCULAR CONGESTION AND REACTIVE FOVEOLAR HYPERPLASIA. - OXYNTIC MUCOSA WITH MILD CHRONIC GASTRITIS. - NEGATIVE FOR H.PYLORI, DYSPLASIA AND MALIGNANCY. . Part B: GEJ COLD BIOPSY: - SQUAMOCOLUMNAR MUCOSA WITH INTESTINAL METAPLASIA, COMPATIBLE WITH BARRETT ESOPHAGUS GIVEN ENDOSCOPIC FINDINGS. - NEGATIVE FOR DYSPLASIA AND MALIGNANCY     EGD/Colonoscopy  2012  Diagnosis: Part A: ANTRUM AND BODY OF STOMACH COLD BIOPSY: - ANTRAL AND OXYNTIC MUCOSA WITH MILD CHRONIC GASTRITIS. - NEGATIVE FOR H.PYLORI, DYSPLASIA AND MALIGNANCY. . Part B: GEJ COLD BIOPSY: - SQUAMOCOLUMNAR MUCOSA WITH REFLUX GASTROESOPHAGITIS. - FOCAL INTESTINAL METAPLASIA, SEE COMMENT. - NEGATIVE FOR DYSPLASIA AND MALIGNANCY. . Part C: CECUM POLYP COLD BIOPSY: - HYPERPLASTIC POLYP. - PROMINENT LYMPHOID AGGREGATE. - NEGATIVE FOR DYSPLASIA AND MALIGNANCY. . Part D: DISTAL SIGMOID COLON POLYP COLD BIOPSY: - HYPERPLASTIC POLYP. - NEGATIVE FOR DYSPLASIA AND MALIGNANCY     Past Medical History:  Diagnosis Date   Asthma    Barrett esophagus    Barrett's esophagus    C. difficile diarrhea 03/2016   hospitalized in [place] x 4 d w c diff after colonoscopy    COPD (chronic obstructive pulmonary disease) (HCC)    Depression    Diarrhea    Diverticulosis    GERD (gastroesophageal reflux disease)    H/O seasonal allergies    H/O sinusitis    unspecified   Headache    migranes   History of hiatal hernia    Hyperlipidemia    Osteoporosis, postmenopausal    Shortness of breath dyspnea    Thrombocytopenia (HCC) 07/15/2021   Possible   Thrombocytopenia (HCC)    Varicose vein      Past Surgical History:  Procedure Laterality Date   APPENDECTOMY     COLONOSCOPY WITH ESOPHAGOGASTRODUODENOSCOPY (EGD)     COLONOSCOPY WITH PROPOFOL  N/A 03/30/2016   Procedure: COLONOSCOPY WITH PROPOFOL ;  Surgeon: Deveron Fly, MD;  Location: [facility] ENDOSCOPY;  Service: Endoscopy;  Laterality: N/A;  COLONOSCOPY WITH PROPOFOL  N/A 09/25/2016   Procedure: COLONOSCOPY WITH PROPOFOL ;  Surgeon: Cassie Click, MD;  Location: [facility] ENDOSCOPY;  Service: Endoscopy;  Laterality: N/A;   COLONOSCOPY WITH PROPOFOL  N/A 08/01/2021   Procedure: COLONOSCOPY WITH PROPOFOL ;  Surgeon: Shane Darling, MD;  Location: [facility] ENDOSCOPY;  Service: Endoscopy;  Laterality: N/A;   DILATION AND CURETTAGE OF UTERUS      ESOPHAGOGASTRODUODENOSCOPY (EGD) WITH PROPOFOL  N/A 03/30/2016   Procedure: ESOPHAGOGASTRODUODENOSCOPY (EGD) WITH PROPOFOL ;  Surgeon: Deveron Fly, MD;  Location: [facility] ENDOSCOPY;  Service: Endoscopy;  Laterality: N/A;   ESOPHAGOGASTRODUODENOSCOPY (EGD) WITH PROPOFOL  N/A 10/11/2019   Procedure: ESOPHAGOGASTRODUODENOSCOPY (EGD) WITH PROPOFOL ;  Surgeon: Marshall Skeeter, MD;  Location: [facility] ENDOSCOPY;  Service: Endoscopy;  Laterality: N/A;   ESOPHAGOGASTRODUODENOSCOPY (EGD) WITH PROPOFOL  N/A 08/01/2021   Procedure: ESOPHAGOGASTRODUODENOSCOPY (EGD) WITH PROPOFOL ;  Surgeon: Shane Darling, MD;  Location: [facility] ENDOSCOPY;  Service: Endoscopy;  Laterality: N/A;   FECAL TRANSPLANT N/A 09/25/2016   Procedure: FECAL TRANSPLANT;  Surgeon: Cassie Click, MD;  Location: [facility] ENDOSCOPY;  Service: Endoscopy;  Laterality: N/A;   TONSILLECTOMY     TUBAL LIGATION     Family History  Problem Relation Age of Onset   Emphysema Mother    Lung cancer Father    Emphysema Father    Atrial fibrillation Father    Atrial fibrillation Brother    Social History   Tobacco Use   Smoking status: Never   Smokeless tobacco: Never  Vaping Use   Vaping status: Never Used  Substance Use Topics   Alcohol use: No   Drug use: No   Current Outpatient Medications  Medication Sig Dispense Refill   ABRYSVO 120 MCG/0.5ML injection      albuterol  (PROVENTIL  HFA;VENTOLIN  HFA) 108 (90 Base) MCG/ACT inhaler Inhale 2 puffs into the lungs every 6 (six) hours as needed for wheezing or shortness of breath.     ALPRAZolam (XANAX) 0.25 MG tablet TAKE 1/2 TO 1 TABLET BY MOUTH ONCE DAILY AS NEEDED     budesonide-formoterol (SYMBICORT) 160-4.5 MCG/ACT inhaler Inhale 1 puff into the lungs 2 (two) times daily.     cyclobenzaprine (FLEXERIL) 5 MG tablet Take 5 mg by mouth 3 (three) times daily as needed for muscle spasms.     fexofenadine (ALLEGRA) 180 MG tablet Take 180 mg by mouth daily.     FLUoxetine (PROZAC) 40 MG capsule  Take 1 tablet by mouth daily.     HYDROcodone-acetaminophen (NORCO) 7.5-325 MG tablet 1 tablet every 6 (six) hours as needed (Do not use every day).      montelukast (SINGULAIR) 10 MG tablet Take 10 mg by mouth at bedtime.     Multiple Vitamin (MULTIVITAMIN) tablet Take 1 tablet by mouth daily.     omeprazole (PRILOSEC) 20 MG capsule Take 20 mg by mouth 2 (two) times daily before a meal.     ondansetron  (ZOFRAN ) 4 MG tablet Take 1 tablet (4 mg total) by mouth daily as needed for nausea or vomiting. 30 tablet 1   ondansetron  (ZOFRAN -ODT) 4 MG disintegrating tablet Take 4 mg by mouth every 8 (eight) hours as needed.     rizatriptan (MAXALT) 10 MG tablet Take 10 mg by mouth as needed for migraine. May repeat in 2 hours if needed     simvastatin (ZOCOR) 20 MG tablet Take 20 mg by mouth daily.     traZODone (DESYREL) 100 MG tablet Take 100 mg by mouth at bedtime.     triamcinolone  cream (KENALOG )  0.1 % Apply twice daily to affected area up to 2 weeks as needed for rash. Avoid applying to face, groin, and axilla. Use as directed. 80 g 2   WIXELA INHUB 250-50 MCG/ACT AEPB SMARTSIG:1 Puff(s) By Mouth Every 12 Hours     No current facility-administered medications for this visit.   Allergies  Allergen Reactions   Topiramate Nausea Only    Shaky, imbalnced   Penicillin G Rash     Review of Systems: All systems reviewed and negative except where noted in HPI.    No results found.  Physical Exam: BP 122/78   Pulse (!) 105   Ht 5\' 4"  (1.626 m)   Wt 153 lb (69.4 kg)   BMI 26.26 kg/m  Constitutional: Pleasant,well-developed, Caucasian female in no acute distress.  Accompanied by husband HEENT: Normocephalic and atraumatic. Conjunctivae are normal. No scleral icterus. Neck supple.  Cardiovascular: Normal rate, regular rhythm.  Pulmonary/chest: Effort normal and breath sounds normal.  Bilateral expiratory wheezing present Abdominal: Soft, nondistended, nontender. Bowel sounds active  throughout. There are no masses palpable. No hepatomegaly. Extremities: no edema Lymphadenopathy: No cervical adenopathy noted. Neurological: Alert and oriented to person place and time. Skin: Skin is warm and dry. No rashes noted. Psychiatric: Normal mood and affect. Behavior is normal.  CBC    Component Value Date/Time   WBC 11.7 (H) 12/21/2023 1005   RBC 4.69 12/21/2023 1005   HGB 14.2 12/21/2023 1005   HCT 42.4 12/21/2023 1005   PLT 100 (L) 12/21/2023 1005   MCV 90.4 12/21/2023 1005   MCH 30.3 12/21/2023 1005   MCHC 33.5 12/21/2023 1005   RDW 12.6 12/21/2023 1005   LYMPHSABS 2.8 12/21/2023 1005   MONOABS 0.5 12/21/2023 1005   EOSABS 0.3 12/21/2023 1005   BASOSABS 0.1 12/21/2023 1005    CMP     Component Value Date/Time   NA 135 05/20/2023 1244   K 3.7 05/20/2023 1244   CL 99 05/20/2023 1244   CO2 23 05/20/2023 1244   GLUCOSE 138 (H) 05/20/2023 1244   BUN 12 05/20/2023 1244   CREATININE 0.82 05/20/2023 1244   CALCIUM 9.4 05/20/2023 1244   PROT 7.8 05/20/2023 1244   ALBUMIN 4.5 05/20/2023 1244   AST 22 05/20/2023 1244   ALT 17 05/20/2023 1244   ALKPHOS 65 05/20/2023 1244   BILITOT 0.6 05/20/2023 1244   GFRNONAA >60 05/20/2023 1244       Latest Ref Rng & Units 12/21/2023   10:05 AM 05/20/2023   12:44 PM 04/26/2023   10:19 AM  CBC EXTENDED  WBC 4.0 - 10.5 K/uL 11.7  17.3  17.4   RBC 3.87 - 5.11 MIL/uL 4.69  5.02  4.68   Hemoglobin 12.0 - 15.0 g/dL 16.1  09.6  04.5   HCT 36.0 - 46.0 % 42.4  46.2  43.2   Platelets 150 - 400 K/uL 100  151  154   NEUT# 1.7 - 7.7 K/uL 8.0     Lymph# 0.7 - 4.0 K/uL 2.8         ASSESSMENT AND PLAN:   73 year old female with asthma/COPD with PRN home O2, GERD/Barrett's, gastric intestinal metaplasia, recurrent C diff and colon polyps, here to establish care, no acute complaints or concerns  Dysphagia Chronic swallowing difficulties with sensation of food impaction. No history of peptic stricture on previous EGDs.  No prior  dilation.  Possible esophageal motility disorder or subtle narrowing.  She is due for GIM surveillance.  Will plan for empiric  dilation - Schedule upper endoscopy with empiric dilation. - Due to home oxygen  requirement, this will need to be done in the hospital setting  Gastroesophageal reflux disease (GERD) Chronic GERD managed with omeprazole. Symptoms well-controlled.  History of [place] Grade A esophagitis on previous EGD. - Continue omeprazole once daily.  Barrett's esophagus Previously diagnosed, absent on most recent endoscopy.  - Due for GIM surveillance.  Will also reassess for presence of Barrett's  Gastric intestinal metaplasia Intestinal metaplasia identified on stomach biopsies. Low risk of progression to gastric cancer. - Perform repeat upper endoscopy with stomach biopsies. - Will probably not need further surveillance if GIM limited to antrum.  Recurrent Clostridioides difficile infection Recurrent infection last year. Current bowel habits regular. High threshold for antibiotic use recommended. Discussed Vowst as an option to prevent recurrence.  Will consider this if she has another infection. - Continue current probiotic regimen. - Avoid unnecessary antibiotics.  Colon polyps Three small polyps removed in 2022. Upated guidelinesrecommend 5 year interval. Preparation difficulty noted. - Repeat colonoscopy in 2027.  Home oxygen  use Intermittent home oxygen  use necessitates hospital setting for procedures. - Schedule endoscopy in a hospital setting.  Recording duration: 18 minutes      The details, risks (including bleeding, perforation, infection, missed lesions, medication reactions and possible hospitalization or surgery if complications occur), benefits, and alternatives to EGD with possible biopsy and possible dilation were discussed with the patient and she consents to proceed.   Jeni Duling E. Cherryl Corona, MD [facility]   Rory Collard, MD

## 2024-05-21 DIAGNOSIS — J449 Chronic obstructive pulmonary disease, unspecified: Secondary | ICD-10-CM | POA: Diagnosis not present

## 2024-05-23 ENCOUNTER — Telehealth: Payer: Self-pay | Admitting: Gastroenterology

## 2024-05-23 NOTE — Telephone Encounter (Signed)
 Procedure:Endoscopy Procedure date: 06/01/24 Procedure location: [facility] Arrival Time: 7:30 am Spoke with the patient Y/N: Yes Any prep concerns? No  Has the patient obtained the prep from the pharmacy ? Yes Do you have a care partner and transportation: Yes Any additional concerns? No

## 2024-06-01 ENCOUNTER — Ambulatory Visit (HOSPITAL_COMMUNITY): Admitting: Anesthesiology

## 2024-06-01 ENCOUNTER — Encounter (HOSPITAL_COMMUNITY): Payer: Self-pay | Admitting: Gastroenterology

## 2024-06-01 ENCOUNTER — Ambulatory Visit (HOSPITAL_COMMUNITY)
Admission: RE | Admit: 2024-06-01 | Discharge: 2024-06-01 | Disposition: A | Discharge status: Home / Self Care | Attending: Gastroenterology | Admitting: Gastroenterology

## 2024-06-01 ENCOUNTER — Encounter (HOSPITAL_COMMUNITY)
Admission: RE | Disposition: A | Discharge status: Home / Self Care | Payer: Self-pay | Source: Home / Self Care | Attending: Gastroenterology

## 2024-06-01 DIAGNOSIS — Z120 Encounter for screening for malignant neoplasm of stomach: Secondary | ICD-10-CM | POA: Insufficient documentation

## 2024-06-01 DIAGNOSIS — F32A Depression, unspecified: Secondary | ICD-10-CM | POA: Insufficient documentation

## 2024-06-01 DIAGNOSIS — R1310 Dysphagia, unspecified: Secondary | ICD-10-CM | POA: Diagnosis not present

## 2024-06-01 DIAGNOSIS — K219 Gastro-esophageal reflux disease without esophagitis: Secondary | ICD-10-CM | POA: Diagnosis not present

## 2024-06-01 DIAGNOSIS — R1319 Other dysphagia: Secondary | ICD-10-CM

## 2024-06-01 DIAGNOSIS — K2970 Gastritis, unspecified, without bleeding: Secondary | ICD-10-CM | POA: Diagnosis not present

## 2024-06-01 DIAGNOSIS — K208 Other esophagitis without bleeding: Secondary | ICD-10-CM | POA: Diagnosis not present

## 2024-06-01 DIAGNOSIS — K3189 Other diseases of stomach and duodenum: Secondary | ICD-10-CM

## 2024-06-01 DIAGNOSIS — K2100 Gastro-esophageal reflux disease with esophagitis, without bleeding: Secondary | ICD-10-CM

## 2024-06-01 DIAGNOSIS — K2289 Other specified disease of esophagus: Secondary | ICD-10-CM | POA: Diagnosis not present

## 2024-06-01 DIAGNOSIS — J4489 Other specified chronic obstructive pulmonary disease: Secondary | ICD-10-CM | POA: Diagnosis not present

## 2024-06-01 DIAGNOSIS — J449 Chronic obstructive pulmonary disease, unspecified: Secondary | ICD-10-CM

## 2024-06-01 DIAGNOSIS — K31A19 Gastric intestinal metaplasia without dysplasia, unspecified site: Secondary | ICD-10-CM | POA: Diagnosis not present

## 2024-06-01 DIAGNOSIS — K31A0 Gastric intestinal metaplasia, unspecified: Secondary | ICD-10-CM | POA: Diagnosis not present

## 2024-06-01 DIAGNOSIS — Z8719 Personal history of other diseases of the digestive system: Secondary | ICD-10-CM | POA: Diagnosis not present

## 2024-06-01 HISTORY — PX: ESOPHAGOGASTRODUODENOSCOPY: SHX5428

## 2024-06-01 SURGERY — EGD (ESOPHAGOGASTRODUODENOSCOPY)
Anesthesia: Monitor Anesthesia Care

## 2024-06-01 MED ORDER — PROPOFOL 10 MG/ML IV BOLUS
INTRAVENOUS | Status: DC | PRN
  Administered 2024-06-01: 30 mg via INTRAVENOUS
  Administered 2024-06-01: 10 mg via INTRAVENOUS
  Administered 2024-06-01: 20 mg via INTRAVENOUS
  Administered 2024-06-01: 30 mg via INTRAVENOUS
  Administered 2024-06-01 (×2): 20 mg via INTRAVENOUS
  Administered 2024-06-01: 100 mg via INTRAVENOUS
  Administered 2024-06-01: 20 mg via INTRAVENOUS

## 2024-06-01 MED ORDER — SODIUM CHLORIDE 0.9 % IV SOLN: INTRAVENOUS | Status: DC

## 2024-06-01 MED ORDER — LIDOCAINE HCL (CARDIAC) PF 100 MG/5ML IV SOSY
PREFILLED_SYRINGE | INTRAVENOUS | Status: DC | PRN
Start: 2024-06-01 — End: 2024-06-01
  Administered 2024-06-01: 100 mg via INTRATRACHEAL

## 2024-06-01 NOTE — Addendum Note (Signed)
 Addendum  created 06/01/24 1228 by Viviana Almarie DASEN, CRNA   Child order released for a procedure order, Clinical Note Signed, Intraprocedure Blocks edited, Intraprocedure Event edited, SmartForm saved

## 2024-06-01 NOTE — Discharge Instructions (Signed)

## 2024-06-01 NOTE — Op Note (Signed)
 [facility] Patient Name: Carly Hoffman Procedure Date : 06/01/2024 MRN: [id number] Attending MD: Glendia BRAVO. Stacia , MD, [id number] Date of Birth: 26-Nov-1950 CSN: [id number] Age: 73 Admit Type: Inpatient Procedure:                Upper GI endoscopy Indications:              Surveillance for malignancy due to personal history                            of premalignant condition (gastric intestinal                            metaplasia), Dysphagia Providers:                Glendia E. Stacia, MD, Ritta Debbie Alert,                            RN, [place], Technician, Almarie DEL, CRNA Referring MD:              Medicines:                Monitored Anesthesia Care Complications:            No immediate complications. Estimated Blood Loss:     Estimated blood loss was minimal. Procedure:                Pre-Anesthesia Assessment:                           - Prior to the procedure, a History and Physical                            was performed, and patient medications and                            allergies were reviewed. The patient's tolerance of                            previous anesthesia was also reviewed. The risks                            and benefits of the procedure and the sedation                            options and risks were discussed with the patient.                            All questions were answered, and informed consent                            was obtained. Prior Anticoagulants: The patient has                            taken no anticoagulant or antiplatelet agents. ASA  Grade Assessment: II - A patient with mild systemic                            disease. After reviewing the risks and benefits,                            the patient was deemed in satisfactory condition to                            undergo the procedure.                           After obtaining informed consent, the endoscope was                             passed under direct vision. Throughout the                            procedure, the patient's blood pressure, pulse, and                            oxygen  saturations were monitored continuously. The                            [id number]-H190 ([id number]) Olympus endoscope was introduced                            through the mouth, and advanced to the second part                            of duodenum. The upper GI endoscopy was                            accomplished without difficulty. The patient                            tolerated the procedure well. Scope In: Scope Out: Findings:      The examined portions of the nasopharynx, oropharynx and larynx were       normal.      The Z-line was irregular with an island of salmon colored mucosa.       Biopsies were taken with a cold forceps for histology. Estimated blood       loss was minimal.      The exam of the esophagus was otherwise normal. No obvious stricture or       stenosis was appreciated.      A guidewire was placed and the scope was withdrawn. Dilation was       performed in the upper third of the esophagus with a Savary dilator with       mild resistance at 17 mm. The endoscope was reinserted and a moderate       sized mucosal rent was seen in the proximal esophagus, just past the       UES. Estimated blood loss was minimal.      Striped mildly erythematous mucosa without bleeding was found in  the       gastric antrum. Biopsies were taken with a cold forceps for gastric       intestinal metaplasia mapping and surveillance. Estimated blood loss was       minimal.      The gastric body and fundus were normal. Biopsies were taken with a cold       forceps for gastric intestinal metaplasia mapping and surveillance.       Estimated blood loss was minimal.      The examined duodenum was normal. Impression:               - The examined portions of the nasopharynx,                            oropharynx and  larynx were normal.                           - Z-line irregular with an island of salmon colored                            mucosa. Biopsied.                           - Erythematous mucosa in the antrum. Biopsied.                           - Normal gastric body. Biopsied.                           - Normal examined duodenum.                           - Dilation performed in the upper third of the                            esophagus with good post dilation effect. Moderate Sedation:      N/A Recommendation:           - Patient has a contact number available for                            emergencies. The signs and symptoms of potential                            delayed complications were discussed with the                            patient. Return to normal activities tomorrow.                            Written discharge instructions were provided to the                            patient.                           - Soft diet today, then regular diet tomorrow.                           -  Continue present medications.                           - Await pathology results.                           - Further surveillance recommendations will be                            based on biopsy results. Procedure Code(s):        --- Professional ---                           [id number], Esophagogastroduodenoscopy, flexible,                            transoral; with insertion of guide wire followed by                            passage of dilator(s) through esophagus over guide                            wire                           43239, 59, Esophagogastroduodenoscopy, flexible,                            transoral; with biopsy, single or multiple Diagnosis Code(s):        --- Professional ---                           K22.89, Other specified disease of esophagus                           K31.89, Other diseases of stomach and duodenum                           Z87.19, Personal history of  other diseases of the                            digestive system                           R13.10, Dysphagia, unspecified CPT copyright 2022 American Medical Association. All rights reserved. The codes documented in this report are preliminary and upon coder review may  be revised to meet current compliance requirements. Taheera Thomann E. Stacia, MD 06/01/2024 9:31:51 AM This report has been signed electronically. Number of Addenda: 0

## 2024-06-01 NOTE — Anesthesia Postprocedure Evaluation (Signed)
 Anesthesia Post Note  Patient: Carly Hoffman  Procedure(s) Performed: EGD (ESOPHAGOGASTRODUODENOSCOPY)     Patient location during evaluation: Endoscopy Anesthesia Type: MAC Level of consciousness: awake and alert Pain management: pain level controlled Vital Signs Assessment: post-procedure vital signs reviewed and stable Respiratory status: spontaneous breathing, nonlabored ventilation and respiratory function stable Cardiovascular status: stable and blood pressure returned to baseline Postop Assessment: no apparent nausea or vomiting Anesthetic complications: no   No notable events documented.  Last Vitals:  Vitals:   06/01/24 0935 06/01/24 0945  BP: 116/63 130/61  Pulse: 73 65  Resp: 17 16  Temp:    SpO2: 96% 96%    Last Pain:  Vitals:   06/01/24 0945  TempSrc:   PainSc: 0-No pain                 Martyn Timme,W. EDMOND

## 2024-06-01 NOTE — H&P (Signed)
 [facility] History and Physical   Primary Care Physician:  Glover Lenis, MD   Reason for Procedure:   Dysphagia, gastric intestinal metaplasia  Plan:    EGD with dilation and gastric biopsies     HPI: Carly Hoffman is a 73 y.o. female undergoing EGD for empiric dilation of chronic dysphagia and for GIM surveillance.  She was noted to have GIM on antral biopsies in 2022.  She also has a history of short segment Barrett's, but had no Barrett's noted in 2022.    Past Medical History:  Diagnosis Date   Asthma    Barrett esophagus    Barrett's esophagus    C. difficile diarrhea 03/2016   hospitalized in [place] x 4 d w c diff after colonoscopy    COPD (chronic obstructive pulmonary disease) (HCC)    Depression    Diarrhea    Diverticulosis    GERD (gastroesophageal reflux disease)    H/O seasonal allergies    H/O sinusitis    unspecified   Headache    migranes   History of hiatal hernia    Hyperlipidemia    Osteoporosis, postmenopausal    Shortness of breath dyspnea    Thrombocytopenia (HCC) 07/15/2021   Possible   Thrombocytopenia (HCC)    Varicose vein     Past Surgical History:  Procedure Laterality Date   APPENDECTOMY     COLONOSCOPY WITH ESOPHAGOGASTRODUODENOSCOPY (EGD)     COLONOSCOPY WITH PROPOFOL  N/A 03/30/2016   Procedure: COLONOSCOPY WITH PROPOFOL ;  Surgeon: Gladis RAYMOND Mariner, MD;  Location: [facility] ENDOSCOPY;  Service: Endoscopy;  Laterality: N/A;   COLONOSCOPY WITH PROPOFOL  N/A 09/25/2016   Procedure: COLONOSCOPY WITH PROPOFOL ;  Surgeon: Lamar ONEIDA Holmes, MD;  Location: [facility] ENDOSCOPY;  Service: Endoscopy;  Laterality: N/A;   COLONOSCOPY WITH PROPOFOL  N/A 08/01/2021   Procedure: COLONOSCOPY WITH PROPOFOL ;  Surgeon: Maryruth Ole ONEIDA, MD;  Location: [facility] ENDOSCOPY;  Service: Endoscopy;  Laterality: N/A;   DILATION AND CURETTAGE OF UTERUS     ESOPHAGOGASTRODUODENOSCOPY (EGD) WITH PROPOFOL  N/A 03/30/2016   Procedure: ESOPHAGOGASTRODUODENOSCOPY (EGD)  WITH PROPOFOL ;  Surgeon: Gladis RAYMOND Mariner, MD;  Location: [facility] ENDOSCOPY;  Service: Endoscopy;  Laterality: N/A;   ESOPHAGOGASTRODUODENOSCOPY (EGD) WITH PROPOFOL  N/A 10/11/2019   Procedure: ESOPHAGOGASTRODUODENOSCOPY (EGD) WITH PROPOFOL ;  Surgeon: Dessa Reyes ORN, MD;  Location: [facility] ENDOSCOPY;  Service: Endoscopy;  Laterality: N/A;   ESOPHAGOGASTRODUODENOSCOPY (EGD) WITH PROPOFOL  N/A 08/01/2021   Procedure: ESOPHAGOGASTRODUODENOSCOPY (EGD) WITH PROPOFOL ;  Surgeon: Maryruth Ole ONEIDA, MD;  Location: [facility] ENDOSCOPY;  Service: Endoscopy;  Laterality: N/A;   FECAL TRANSPLANT N/A 09/25/2016   Procedure: FECAL TRANSPLANT;  Surgeon: Lamar ONEIDA Holmes, MD;  Location: [facility] ENDOSCOPY;  Service: Endoscopy;  Laterality: N/A;   TONSILLECTOMY     TUBAL LIGATION      Prior to Admission medications   Medication Sig Start Date End Date Taking? Authorizing Provider  albuterol  (PROVENTIL  HFA;VENTOLIN  HFA) 108 (90 Base) MCG/ACT inhaler Inhale 2 puffs into the lungs every 6 (six) hours as needed for wheezing or shortness of breath.   Yes [provider]  ALPRAZolam (XANAX) 0.25 MG tablet TAKE 1/2 TO 1 TABLET BY MOUTH ONCE DAILY AS NEEDED 07/06/22  Yes [provider]  cyclobenzaprine (FLEXERIL) 5 MG tablet Take 5 mg by mouth 3 (three) times daily as needed for muscle spasms.   Yes [provider]  fexofenadine (ALLEGRA) 180 MG tablet Take 180 mg by mouth daily.   Yes [provider]  FLUoxetine (PROZAC) 40 MG capsule Take  1 tablet by mouth daily. 03/11/22  Yes [provider]  HYDROcodone-acetaminophen (NORCO) 7.5-325 MG tablet 1 tablet every 6 (six) hours as needed (Do not use every day).  01/08/17  Yes [provider]  Multiple Vitamin (MULTIVITAMIN) tablet Take 1 tablet by mouth daily.   Yes [provider]  omeprazole (PRILOSEC) 20 MG capsule Take 20 mg by mouth 2 (two) times daily before a meal.   Yes [provider]  ondansetron   (ZOFRAN -ODT) 4 MG disintegrating tablet Take 4 mg by mouth every 8 (eight) hours as needed.   Yes [provider]  rizatriptan (MAXALT) 10 MG tablet Take 10 mg by mouth as needed for migraine. May repeat in 2 hours if needed   Yes [provider]  simvastatin (ZOCOR) 20 MG tablet Take 20 mg by mouth daily.   Yes [provider]  traZODone (DESYREL) 100 MG tablet Take 100 mg by mouth at bedtime.   Yes [provider]  NAPOLEON INHUB 250-50 MCG/ACT AEPB SMARTSIG:1 Puff(s) By Mouth Every 12 Hours   Yes [provider]    Current Facility-Administered Medications  Medication Dose Route Frequency Provider Last Rate Last Admin   0.9 %  sodium chloride  infusion   Intravenous Continuous Stacia Glendia BRAVO, MD 20 mL/hr at 06/01/24 0816 New Bag at 06/01/24 0816    Allergies as of 05/01/2024 - Review Complete 05/01/2024  Allergen Reaction Noted   Topiramate Nausea Only 04/24/2021   Penicillin g Rash 04/25/2014    Family History  Problem Relation Age of Onset   Emphysema Mother    Lung cancer Father    Emphysema Father    Atrial fibrillation Father    Atrial fibrillation Brother    Liver disease Neg Hx    Esophageal cancer Neg Hx    Colon cancer Neg Hx     Social History   Socioeconomic History   Marital status: Divorced    Spouse name: Not on file   Number of children: 4   Years of education: Not on file   Highest education level: Not on file  Occupational History   Occupation: retired  Tobacco Use   Smoking status: Never   Smokeless tobacco: Never  Vaping Use   Vaping status: Never Used  Substance and Sexual Activity   Alcohol use: No   Drug use: No   Sexual activity: Yes  Other Topics Concern   Not on file  Social History Narrative   Not on file   Social Drivers of Health   Financial Resource Strain: Low Risk  (11/09/2023)   Received from [facility]   Overall Financial Resource Strain (CARDIA)     Difficulty of Paying Living Expenses: Not hard at all  Food Insecurity: No Food Insecurity (11/09/2023)   Received from [facility]   Hunger Vital Sign    Within the past 12 months, you worried that your food would run out before you got the money to buy more.: Never true    Within the past 12 months, the food you bought just didn't last and you didn't have money to get more.: Never true  Transportation Needs: No Transportation Needs (11/09/2023)   Received from [facility] - Transportation    In the past 12 months, has lack of transportation kept you from medical appointments or from getting medications?: No    Lack of Transportation (Non-Medical): No  Physical Activity: Not on file  Stress: Not  on file  Social Connections: Not on file  Intimate Partner Violence: Not on file    Review of Systems:  All other review of systems negative except as mentioned in the HPI.  Physical Exam: Vital signs BP (!) 153/68   Pulse 82   Temp (!) 97.3 F (36.3 C) (Tympanic)   Resp 14   Ht 5' 4 (1.626 m)   Wt 69.4 kg   SpO2 95%   BMI 26.26 kg/m   General:   Alert,  Well-developed, well-nourished, pleasant and cooperative in NAD Airway:  Mallampati 2 Lungs:  Clear throughout to auscultation.   Heart:  Regular rate and rhythm; no murmurs, clicks, rubs,  or gallops. Abdomen:  Soft, nontender and nondistended. Normal bowel sounds.   Neuro/Psych:  Normal mood and affect. A and O x 3   Jerred Zaremba E. Stacia, MD [facility]

## 2024-06-01 NOTE — Anesthesia Procedure Notes (Signed)
 Procedure Name: MAC Date/Time: 06/01/2024 8:57 AM  Performed by: Viviana Almarie DASEN, CRNAPre-anesthesia Checklist: Patient identified, Emergency Drugs available, Suction available, Patient being monitored and Timeout performed Patient Re-evaluated:Patient Re-evaluated prior to induction Oxygen  Delivery Method: Nasal cannula Induction Type: IV induction

## 2024-06-01 NOTE — Transfer of Care (Signed)
 Immediate Anesthesia Transfer of Care Note  Patient: Carly Hoffman  Procedure(s) Performed: EGD (ESOPHAGOGASTRODUODENOSCOPY)  Patient Location: Endoscopy Unit  Anesthesia Type:MAC  Level of Consciousness: awake, alert , oriented, and patient cooperative  Airway & Oxygen  Therapy: Patient Spontanous Breathing  Post-op Assessment: Report given to RN, Post -op Vital signs reviewed and stable, Patient moving all extremities X 4, and Patient able to stick tongue midline  Post vital signs: Reviewed and stable  Last Vitals:  Vitals Value Taken Time  BP 120/50 06/01/24 09:25  Temp 36.8 C 06/01/24 09:25  Pulse 78 06/01/24 09:25  Resp 16 06/01/24 09:25  SpO2 95 % 06/01/24 09:25    Last Pain:  Vitals:   06/01/24 0925  TempSrc: Tympanic  PainSc: 0-No pain         Complications: No notable events documented.

## 2024-06-01 NOTE — Anesthesia Preprocedure Evaluation (Addendum)
 Anesthesia Evaluation  Patient identified by MRN, date of birth, ID band Patient awake    Reviewed: Allergy & Precautions, H&P , NPO status , Patient's Chart, lab work & pertinent test results  Airway Mallampati: II  TM Distance: >3 FB Neck ROM: Full    Dental no notable dental hx. (+) Teeth Intact, Dental Advisory Given   Pulmonary asthma , COPD,  COPD inhaler   Pulmonary exam normal breath sounds clear to auscultation       Cardiovascular negative cardio ROS  Rhythm:Regular Rate:Normal     Neuro/Psych  Headaches   Depression       GI/Hepatic Neg liver ROS, hiatal hernia,GERD  Medicated,,  Endo/Other  negative endocrine ROS    Renal/GU negative Renal ROS  negative genitourinary   Musculoskeletal   Abdominal   Peds  Hematology negative hematology ROS (+)   Anesthesia Other Findings   Reproductive/Obstetrics negative OB ROS                              Anesthesia Physical Anesthesia Plan  ASA: 2  Anesthesia Plan: MAC   Post-op Pain Management: Minimal or no pain anticipated   Induction: Intravenous  PONV Risk Score and Plan: 2 and Propofol  infusion and Treatment may vary due to age or medical condition  Airway Management Planned: Natural Airway and Simple Face Mask  Additional Equipment:   Intra-op Plan:   Post-operative Plan:   Informed Consent: I have reviewed the patients History and Physical, chart, labs and discussed the procedure including the risks, benefits and alternatives for the proposed anesthesia with the patient or authorized representative who has indicated his/her understanding and acceptance.     Dental advisory given  Plan Discussed with: CRNA  Anesthesia Plan Comments:          Anesthesia Quick Evaluation

## 2024-06-02 LAB — SURGICAL PATHOLOGY

## 2024-06-07 ENCOUNTER — Ambulatory Visit: Payer: Self-pay | Admitting: Gastroenterology

## 2024-06-07 NOTE — Progress Notes (Signed)
 Ms. Binette,  The biopsies of your stomach were essentially normal and did not reveal any gastric intestinal metaplasia or any other concerning findings.  The biopsies of the junction or your stomach and esophagus did show a focal area of intestinal metaplasia (Barrett's esophagus).   Your risk of esophageal is quite low based on your risk factors, and given your age, I would recommend against any further Barrett's surveillance. However, if you are still in good health in 5 years, a final surveillance could be considered at that time.

## 2024-06-19 ENCOUNTER — Other Ambulatory Visit: Payer: PPO

## 2024-06-20 DIAGNOSIS — K219 Gastro-esophageal reflux disease without esophagitis: Secondary | ICD-10-CM | POA: Diagnosis not present

## 2024-06-20 DIAGNOSIS — J4530 Mild persistent asthma, uncomplicated: Secondary | ICD-10-CM | POA: Diagnosis not present

## 2024-06-20 DIAGNOSIS — J449 Chronic obstructive pulmonary disease, unspecified: Secondary | ICD-10-CM | POA: Diagnosis not present

## 2024-06-20 DIAGNOSIS — R053 Chronic cough: Secondary | ICD-10-CM | POA: Diagnosis not present

## 2024-07-21 DIAGNOSIS — J449 Chronic obstructive pulmonary disease, unspecified: Secondary | ICD-10-CM | POA: Diagnosis not present

## 2024-08-21 DIAGNOSIS — J449 Chronic obstructive pulmonary disease, unspecified: Secondary | ICD-10-CM | POA: Diagnosis not present

## 2024-10-04 DIAGNOSIS — K219 Gastro-esophageal reflux disease without esophagitis: Secondary | ICD-10-CM | POA: Diagnosis not present

## 2024-10-04 DIAGNOSIS — R7309 Other abnormal glucose: Secondary | ICD-10-CM | POA: Diagnosis not present

## 2024-10-04 DIAGNOSIS — D696 Thrombocytopenia, unspecified: Secondary | ICD-10-CM | POA: Diagnosis not present

## 2024-10-04 DIAGNOSIS — Z0000 Encounter for general adult medical examination without abnormal findings: Secondary | ICD-10-CM | POA: Diagnosis not present

## 2024-10-04 DIAGNOSIS — M810 Age-related osteoporosis without current pathological fracture: Secondary | ICD-10-CM | POA: Diagnosis not present

## 2024-10-04 DIAGNOSIS — G43009 Migraine without aura, not intractable, without status migrainosus: Secondary | ICD-10-CM | POA: Diagnosis not present

## 2024-10-04 DIAGNOSIS — E785 Hyperlipidemia, unspecified: Secondary | ICD-10-CM | POA: Diagnosis not present

## 2024-10-04 DIAGNOSIS — Z1331 Encounter for screening for depression: Secondary | ICD-10-CM | POA: Diagnosis not present

## 2024-10-04 DIAGNOSIS — J4530 Mild persistent asthma, uncomplicated: Secondary | ICD-10-CM | POA: Diagnosis not present

## 2024-10-04 DIAGNOSIS — F32A Depression, unspecified: Secondary | ICD-10-CM | POA: Diagnosis not present

## 2024-10-13 DIAGNOSIS — M3501 Sicca syndrome with keratoconjunctivitis: Secondary | ICD-10-CM | POA: Diagnosis not present

## 2024-10-13 DIAGNOSIS — H47393 Other disorders of optic disc, bilateral: Secondary | ICD-10-CM | POA: Diagnosis not present

## 2024-10-13 DIAGNOSIS — H40003 Preglaucoma, unspecified, bilateral: Secondary | ICD-10-CM | POA: Diagnosis not present

## 2024-10-13 DIAGNOSIS — H2513 Age-related nuclear cataract, bilateral: Secondary | ICD-10-CM | POA: Diagnosis not present

## 2024-10-24 DIAGNOSIS — J4489 Other specified chronic obstructive pulmonary disease: Secondary | ICD-10-CM | POA: Diagnosis not present

## 2024-10-24 DIAGNOSIS — R051 Acute cough: Secondary | ICD-10-CM | POA: Diagnosis not present

## 2024-10-28 ENCOUNTER — Other Ambulatory Visit: Payer: Self-pay

## 2024-10-28 ENCOUNTER — Emergency Department

## 2024-10-28 ENCOUNTER — Encounter: Payer: Self-pay | Admitting: Intensive Care

## 2024-10-28 ENCOUNTER — Emergency Department
Admission: EM | Admit: 2024-10-28 | Discharge: 2024-10-28 | Disposition: A | Discharge status: Home / Self Care | Attending: Emergency Medicine | Admitting: Emergency Medicine

## 2024-10-28 DIAGNOSIS — M19079 Primary osteoarthritis, unspecified ankle and foot: Secondary | ICD-10-CM | POA: Diagnosis not present

## 2024-10-28 DIAGNOSIS — M25572 Pain in left ankle and joints of left foot: Secondary | ICD-10-CM | POA: Diagnosis not present

## 2024-10-28 DIAGNOSIS — S82832A Other fracture of upper and lower end of left fibula, initial encounter for closed fracture: Secondary | ICD-10-CM | POA: Diagnosis not present

## 2024-10-28 DIAGNOSIS — S92352A Displaced fracture of fifth metatarsal bone, left foot, initial encounter for closed fracture: Secondary | ICD-10-CM | POA: Diagnosis not present

## 2024-10-28 DIAGNOSIS — J4530 Mild persistent asthma, uncomplicated: Secondary | ICD-10-CM | POA: Diagnosis not present

## 2024-10-28 DIAGNOSIS — M7989 Other specified soft tissue disorders: Secondary | ICD-10-CM | POA: Diagnosis not present

## 2024-10-28 DIAGNOSIS — W08XXXA Fall from other furniture, initial encounter: Secondary | ICD-10-CM | POA: Insufficient documentation

## 2024-10-28 DIAGNOSIS — S82839A Other fracture of upper and lower end of unspecified fibula, initial encounter for closed fracture: Secondary | ICD-10-CM

## 2024-10-28 DIAGNOSIS — M7732 Calcaneal spur, left foot: Secondary | ICD-10-CM | POA: Diagnosis not present

## 2024-10-28 DIAGNOSIS — S99192A Other physeal fracture of left metatarsal, initial encounter for closed fracture: Secondary | ICD-10-CM

## 2024-10-28 MED ORDER — ACETAMINOPHEN 325 MG PO TABS
650.0000 mg | ORAL_TABLET | Freq: Once | ORAL | Status: AC
  Administered 2024-10-28: 650 mg via ORAL
  Filled 2024-10-28: qty 2

## 2024-10-28 NOTE — Discharge Instructions (Signed)
 Please remain nonweightbearing and use the boot.  Rest, ice, elevate your foot.  Please follow-up with podiatry.  Call the phone number provided on Monday.  Please return for any new, worsening, or changing symptoms or other concerns.  It was a pleasure caring for you today

## 2024-10-28 NOTE — ED Provider Notes (Signed)
 [facility] Provider Note    Event Date/Time   First MD Initiated Contact with Patient 10/28/24 1006     (approximate)   History   Fall   HPI  Carly Hoffman is a 73 y.o. female who presents today for evaluation of foot injury.  Patient reports that she was sleeping on the couch with her legs crossed, and her foot fell asleep.  When she went to stand up she fell.  She reports that she has been able to ambulate since the fall, though has pain with ambulation.  There was no head strike or LOC.  She has not had any nausea or vomiting.  She is not anticoagulated.  Patient Active Problem List   Diagnosis Date Noted   Esophageal dysphagia 06/01/2024   Gastric intestinal metaplasia 06/01/2024   Mild persistent asthma without complication 03/13/2019   Elevated hemoglobin A1c 01/24/2018   Leukemoid reaction 05/12/2017   Enteritis due to Clostridium difficile 10/13/2016   Abdominal bruit 10/13/2016   Asymptomatic varicose veins 10/13/2016   Hyperlipidemia 11/08/2015   Allergy 11/08/2015   Osteoporosis, post-menopausal 11/08/2015   Depression 04/29/2015   GERD (gastroesophageal reflux disease) 04/26/2014   Migraines 04/26/2014   Headache 11/11/2013          Physical Exam   Triage Vital Signs: ED Triage Vitals  Encounter Vitals Group     BP 10/28/24 0946 (!) 141/57     Girls Systolic BP Percentile --      Girls Diastolic BP Percentile --      Boys Systolic BP Percentile --      Boys Diastolic BP Percentile --      Pulse Rate 10/28/24 0946 78     Resp 10/28/24 0946 17     Temp 10/28/24 0946 98.4 F (36.9 C)     Temp Source 10/28/24 0946 Oral     SpO2 10/28/24 0946 91 %     Weight 10/28/24 0937 162 lb (73.5 kg)     Height 10/28/24 0937 5' 5.5 (1.664 m)     Head Circumference --      Peak Flow --      Pain Score 10/28/24 0937 8     Pain Loc --      Pain Education --      Exclude from Growth Chart --     Most recent vital signs: Vitals:    10/28/24 0946 10/28/24 1215  BP: (!) 141/57 (!) 140/60  Pulse: 78 70  Resp: 17 16  Temp: 98.4 F (36.9 C)   SpO2: 91% 94%    Physical Exam Vitals and nursing note reviewed.  Constitutional:      General: Awake and alert. No acute distress.    Appearance: Normal appearance. The patient is normal weight.  HENT:     Head: Normocephalic and atraumatic.     Mouth: Mucous membranes are moist.  Eyes:     General: PERRL. Normal EOMs        Right eye: No discharge.        Left eye: No discharge.     Conjunctiva/sclera: Conjunctivae normal.  Cardiovascular:     Rate and Rhythm: Normal rate.     Pulses: Normal pulses.  Pulmonary:     Effort: Pulmonary effort is normal. No respiratory distress.     Breath sounds: Normal breath sounds.  Abdominal:     Abdomen is soft. There is no abdominal tenderness. No rebound or guarding. No distention. Musculoskeletal:  General: No swelling. Normal range of motion.     Cervical back: Normal range of motion and neck supple.  Left ankle: Tenderness, ecchymosis and swelling over the lateral ankle, with ttp to lateral malleolus, no ttp to medial malleolus. TTP to proximal fifth metacarpal with ecchymosis.  No open wounds.  No proximal fibular tenderness. 2+ pedal pulses with brisk capillary refill. Intact distal sensation and strength with normal ROM. Able to plantar flex and dorsiflex against resistance. Able to invert and evert against resistance.  Negative Thompson test Skin:    General: Skin is warm and dry.     Capillary Refill: Capillary refill takes less than 2 seconds.     Findings: No rash.  Neurological:     Mental Status: The patient is awake and alert.      ED Results / Procedures / Treatments   Labs (all labs ordered are listed, but only abnormal results are displayed) Labs Reviewed - No data to display   EKG     RADIOLOGY I independently reviewed and interpreted imaging and agree with radiologists  findings.     PROCEDURES:  Critical Care performed:   Procedures   MEDICATIONS ORDERED IN ED: Medications  acetaminophen  (TYLENOL ) tablet 650 mg (650 mg Oral Given 10/28/24 1210)     IMPRESSION / MDM / ASSESSMENT AND PLAN / ED COURSE  I reviewed the triage vital signs and the nursing notes.   Differential diagnosis includes, but is not limited to, fracture, contusion, sprain.  Patient is awake and alert, neurovascularly intact.  She has ecchymosis and swelling over the lateral foot and lateral ankle.  Her foot is warm and well-perfused.  No open wounds.  X-rays obtained do reveal findings consistent with a nondisplaced fifth metatarsal fracture as well as a distal fibula avulsion fracture.  Patient was placed in a cam boot.  I instructed her to remain nonweightbearing, reports that she has crutches in the car which she feels comfortable using.  I recommended that she follow-up with podiatry, and the appropriate follow-up information was provided.  She was encouraged to call on Monday when the office opens.  In the meantime, we discussed rest, ice, and elevation.  She was treated with Tylenol  with good effect.  We discussed return precautions.  Patient understands and agrees with plan.  She was discharged in stable condition.   Patient's presentation is most consistent with acute complicated illness / injury requiring diagnostic workup.     FINAL CLINICAL IMPRESSION(S) / ED DIAGNOSES   Final diagnoses:  Closed fracture of base of fifth metatarsal bone of left foot at metaphyseal-diaphyseal junction, initial encounter  Avulsion fracture of distal fibula     Rx / DC Orders   ED Discharge Orders     None        Note:  This document was prepared using Dragon voice recognition software and may include unintentional dictation errors.   Rachael Ferrie E, PA-C 10/28/24 1335    Willo Dunnings, MD 10/28/24 1425

## 2024-10-28 NOTE — ED Notes (Signed)
 See triage note  Presents with pain to left foot/ankle  States she fell when she got up from the couch   Was able to go to bed last pm after the fall  Today  swelling and bruising noted  Good pulses

## 2024-10-28 NOTE — ED Triage Notes (Signed)
 Last night around 11pm, patient reports she went to get up from couch and go to bed and her foot was asleep and caused her to fall. Swelling and bruising present to left foot/ankle  A&O x4 during triage

## 2024-12-20 ENCOUNTER — Ambulatory Visit: Payer: PPO | Admitting: Oncology

## 2024-12-20 ENCOUNTER — Other Ambulatory Visit: Payer: PPO
